# Patient Record
Sex: Male | Born: 1970 | Race: White | Hispanic: No | Marital: Married | Smoking: Never smoker
Health system: Southern US, Community
[De-identification: ages and names within clinical notes are randomized; demographics above are authoritative.]

## PROBLEM LIST (undated history)

## (undated) DIAGNOSIS — K219 Gastro-esophageal reflux disease without esophagitis: Secondary | ICD-10-CM

## (undated) DIAGNOSIS — I10 Essential (primary) hypertension: Secondary | ICD-10-CM

## (undated) HISTORY — PX: ADENOIDECTOMY: SUR15

## (undated) HISTORY — DX: Essential (primary) hypertension: I10

## (undated) HISTORY — PX: TONSILLECTOMY: SUR1361

## (undated) HISTORY — DX: Gastro-esophageal reflux disease without esophagitis: K21.9

---
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
## 2003-04-07 HISTORY — PX: FOOT SURGERY: SHX648

## 2011-04-07 HISTORY — PX: SHOULDER SURGERY: SHX246

## 2014-04-06 HISTORY — PX: BACK SURGERY: SHX140

## 2015-04-07 HISTORY — PX: BUNIONECTOMY: SHX129

## 2016-03-04 ENCOUNTER — Ambulatory Visit: Payer: Self-pay | Admitting: Allergy and Immunology

## 2016-03-04 ENCOUNTER — Ambulatory Visit (INDEPENDENT_AMBULATORY_CARE_PROVIDER_SITE_OTHER): Payer: No Typology Code available for payment source | Admitting: Allergy and Immunology

## 2016-03-04 ENCOUNTER — Encounter: Payer: Self-pay | Admitting: Allergy and Immunology

## 2016-03-04 VITALS — BP 150/98 | HR 88 | Temp 97.4°F | Resp 18 | Ht 68.43 in | Wt 266.8 lb

## 2016-03-04 DIAGNOSIS — J3089 Other allergic rhinitis: Secondary | ICD-10-CM | POA: Diagnosis not present

## 2016-03-04 MED ORDER — AZELASTINE HCL 0.1 % NA SOLN: 2.0000 | Freq: Two times a day (BID) | NASAL | 3 refills | Status: DC

## 2016-03-04 MED ORDER — AZELASTINE HCL 0.1 % NA SOLN: 2.0000 | Freq: Two times a day (BID) | NASAL | 0 refills | Status: DC

## 2016-03-04 MED ORDER — OLOPATADINE HCL 0.7 % OP SOLN: 1.0000 [drp] | OPHTHALMIC | 0 refills | Status: DC

## 2016-03-04 MED ORDER — MONTELUKAST SODIUM 10 MG PO TABS: 10.0000 mg | ORAL_TABLET | Freq: Every day | ORAL | 0 refills | Status: AC

## 2016-03-04 MED ORDER — METHYLPREDNISOLONE ACETATE 80 MG/ML IJ SUSP
80.0000 mg | Freq: Once | INTRAMUSCULAR | Status: AC
  Administered 2016-03-04: 80 mg via INTRAMUSCULAR

## 2016-03-04 MED ORDER — OLOPATADINE HCL 0.7 % OP SOLN: 1.0000 [drp] | OPHTHALMIC | 5 refills | Status: DC

## 2016-03-04 MED ORDER — MONTELUKAST SODIUM 10 MG PO TABS: 10.0000 mg | ORAL_TABLET | Freq: Every day | ORAL | 5 refills | Status: DC

## 2016-03-04 NOTE — Progress Notes (Signed)
Dear [name],  Thank you for referring Justin Greer to the [facility] [place] on 03/04/2016.   Below is a summation of this patient's evaluation and recommendations.  Thank you for your referral. I will keep you informed about this patient's response to treatment.   If you have any questions please do not hesitate to contact me.   Sincerely,  Jessica Priest, MD  [facility]   ______________________________________________________________________    NEW PATIENT NOTE  Referring Provider: [name], [place] Primary Provider: [facility] Date of office visit: 03/04/2016    Subjective:   Chief Complaint:  Justin Greer (DOB: [birth year]) is a 45 y.o. male who presents to the clinic on 03/04/2016 with a chief complaint of Sinus Problem (sneezing, runny nose, congestion); Burning Eyes (watery, itchy); and itchy throat .     HPI: Justin Greer presents to this clinic in evaluation of allergies. He has a long history of seasonal allergies that were precipitated by exposure to cat and pollen, but since 2017 he has had perennial symptoms manifested as severe and intense sneezing and runny nose and nasal congestion and itchy throat and itchy eyes and itchy ears that has not responded to the administration of several different nasal steroids and antihistamines and decongestants. He has not really noted any significant environmental change that may account for his increased sensitivity.  Past Medical History:  Diagnosis Date  . GERD (gastroesophageal reflux disease)   . High blood pressure     Past Surgical History:  Procedure Laterality Date  . ADENOIDECTOMY    . BACK SURGERY  2016  . BUNIONECTOMY Right 2017  . FOOT SURGERY Right 2005  . lymphnode removal  1981  . SHOULDER SURGERY Left 2013  . TONSILLECTOMY        Medication List      ANTIHISTAMINE PO Take 1 tablet by mouth daily as  needed.   diazepam 10 MG tablet Commonly known as:  VALIUM Take 10 mg by mouth every 8 (eight) hours as needed for anxiety.   DULoxetine 60 MG capsule Commonly known as:  CYMBALTA Take 60 mg by mouth daily.   hydrochlorothiazide 25 MG tablet Commonly known as:  HYDRODIURIL Take 25 mg by mouth daily.   losartan 100 MG tablet Commonly known as:  COZAAR Take 10 mg by mouth daily.   OXcarbazepine 150 MG tablet Commonly known as:  TRILEPTAL Take 150 mg by mouth daily.   pantoprazole 40 MG tablet Commonly known as:  PROTONIX Take 40 mg by mouth daily.   propranolol 10 MG tablet Commonly known as:  INDERAL Take 10 mg by mouth daily.   spironolactone 25 MG tablet Commonly known as:  ALDACTONE Take 25 mg by mouth daily.       No Known Allergies  Review of systems negative except as noted in HPI / PMHx or noted below:  Review of Systems  Constitutional: Negative.   HENT: Negative.   Eyes: Negative.   Respiratory: Negative.   Cardiovascular: Negative.   Gastrointestinal: Negative.   Genitourinary: Negative.   Musculoskeletal: Negative.   Skin: Negative.   Neurological: Negative.   Endo/Heme/Allergies: Negative.   Psychiatric/Behavioral: Negative.     Family History  Problem Relation Age of Onset  . High blood pressure Mother   . Heart disease Mother   . Diabetes Mother   . Diabetes Father   . Heart disease Father   .  High blood pressure Father   . Stroke Father   . High blood pressure Brother     Social History   Social History  . Marital status: Married    Spouse name: N/A  . Number of children: N/A  . Years of education: N/A   Occupational History  . Not on file.   Social History Main Topics  . Smoking status: Never Smoker  . Smokeless tobacco: Never Used  . Alcohol use 7.2 oz/week    12 Cans of beer per week  . Drug use: No  . Sexual activity: Not on file   Other Topics Concern  . Not on file   Social History Narrative  . No narrative  on file    Environmental and Social history  Lives in a house with a dry environment, dogs located inside the household, carpeting in the bedroom, no plastic on the bed or pillow, and no smokers located inside the household. He is a disabled Cytogeneticistveteran.  Objective:   Vitals:   03/04/16 1335  BP: (!) 150/98  Pulse: 88  Resp: 18  Temp: 97.4 F (36.3 C)   Height: 5' 8.42" (173.8 cm) Weight: 266 lb 12.8 oz (121 kg)  Physical Exam  Constitutional: He is well-developed, well-nourished, and in no distress.  HENT:  Head: Normocephalic. Head is without right periorbital erythema and without left periorbital erythema.  Right Ear: Tympanic membrane, external ear and ear canal normal.  Left Ear: Tympanic membrane, external ear and ear canal normal.  Nose: Mucosal edema present. No rhinorrhea.  Mouth/Throat: Uvula is midline, oropharynx is clear and moist and mucous membranes are normal. No oropharyngeal exudate.  Eyes: Conjunctivae and lids are normal. Pupils are equal, round, and reactive to light.  Neck: Trachea normal. No tracheal tenderness present. No tracheal deviation present. No thyromegaly present.  Cardiovascular: Normal rate, regular rhythm, S1 normal, S2 normal and normal heart sounds.   No murmur heard. Pulmonary/Chest: Effort normal and breath sounds normal. No stridor. No tachypnea. No respiratory distress. He has no wheezes. He has no rales. He exhibits no tenderness.  Abdominal: Soft. He exhibits no distension and no mass. There is no hepatosplenomegaly. There is no tenderness. There is no rebound and no guarding.  Musculoskeletal: He exhibits no edema or tenderness.  Lymphadenopathy:       Head (right side): No tonsillar adenopathy present.       Head (left side): No tonsillar adenopathy present.    He has no cervical adenopathy.    He has no axillary adenopathy.  Neurological: He is alert. Gait normal.  Skin: No rash noted. He is not diaphoretic. No erythema. No pallor.  Nails show no clubbing.  Psychiatric: Mood and affect normal.    Diagnostics: Allergy skin tests were not performed secondary to the recent administration of a antihistamine.  Assessment and Plan:    1. Other allergic rhinitis     1. Return to clinic Friday for skin testing without any antihistamines  2. Depo-Medrol 80 IM delivered in clinic  3. Use a combination of the following every day:   A. montelukast 10 mg tablet 1 time per day  B. Rhinocort one spray each nostril one time per day. Sample. Coupon  4. If needed:   A. Azelastine 2 sprays each nostril twice a day  B. Pazeo one drop each eye one time per day. Sample  5. Immunotherapy?  Justin Greer obviously has very significant atopic disease that has failed medical therapy and he is definitely  a candidate for immunotherapy. For today I have given him a systemic steroid in addition to a leukotriene modifier and nasal steroid to use on a consistent basis. I'll see him back in this clinic at the end of this week for skin testing to define his aeroallergens hypersensitivity profile.  Jessica PriestEric J. Yaniv Lage, MD [place] [facility] [place]

## 2016-03-04 NOTE — Patient Instructions (Addendum)
  1. Return to clinic Friday for skin testing without any antihistamines  2. Depo-Medrol 80 IM delivered in clinic  3. Use a combination of the following every day:   A. montelukast 10 mg tablet 1 time per day  B. Rhinocort one spray each nostril one time per day. Sample. Coupon  4. If needed:   A. Azelastine 2 sprays each nostril twice a day  B. Pazeo one drop each eye one time per day. Sample  5. Immunotherapy?

## 2016-03-05 ENCOUNTER — Other Ambulatory Visit: Payer: Self-pay

## 2016-03-05 MED ORDER — KETOTIFEN FUMARATE 0.025 % OP SOLN: 1.0000 [drp] | Freq: Two times a day (BID) | OPHTHALMIC | 3 refills | Status: DC

## 2016-03-05 MED ORDER — KETOTIFEN FUMARATE 0.025 % OP SOLN: 1.0000 [drp] | Freq: Two times a day (BID) | OPHTHALMIC | 0 refills | Status: DC

## 2016-03-06 ENCOUNTER — Ambulatory Visit (INDEPENDENT_AMBULATORY_CARE_PROVIDER_SITE_OTHER): Payer: No Typology Code available for payment source | Admitting: Allergy and Immunology

## 2016-03-06 ENCOUNTER — Encounter: Payer: Self-pay | Admitting: Allergy and Immunology

## 2016-03-06 DIAGNOSIS — J3089 Other allergic rhinitis: Secondary | ICD-10-CM | POA: Diagnosis not present

## 2016-03-06 NOTE — Progress Notes (Signed)
Justin Greer presents to this clinic to have skin testing performed. Allergy skin testing directed against a panel of aeroallergens and foods was completed. He demonstrated hypersensitivity to grasses, weeds, dust mite, cat, and dog and mouse. He will perform allergen avoidance measures and continue to use medical therapy as prescribed during his initial evaluation and we will see him back in this clinic in approximately 4 weeks.

## 2016-03-18 ENCOUNTER — Other Ambulatory Visit: Payer: Self-pay | Admitting: Allergy and Immunology

## 2016-03-18 DIAGNOSIS — J3089 Other allergic rhinitis: Secondary | ICD-10-CM

## 2016-03-23 DIAGNOSIS — J3089 Other allergic rhinitis: Secondary | ICD-10-CM

## 2016-03-24 DIAGNOSIS — J3089 Other allergic rhinitis: Secondary | ICD-10-CM

## 2016-03-24 NOTE — Progress Notes (Signed)
Vials made 03-24-16.  jm 

## 2016-04-13 ENCOUNTER — Ambulatory Visit: Payer: Self-pay | Admitting: *Deleted

## 2016-04-13 ENCOUNTER — Ambulatory Visit (INDEPENDENT_AMBULATORY_CARE_PROVIDER_SITE_OTHER): Payer: No Typology Code available for payment source | Admitting: Allergy and Immunology

## 2016-04-13 ENCOUNTER — Ambulatory Visit: Payer: No Typology Code available for payment source

## 2016-04-13 ENCOUNTER — Encounter: Payer: Self-pay | Admitting: Allergy and Immunology

## 2016-04-13 VITALS — BP 138/80 | HR 72 | Resp 18

## 2016-04-13 DIAGNOSIS — J3089 Other allergic rhinitis: Secondary | ICD-10-CM | POA: Diagnosis not present

## 2016-04-13 DIAGNOSIS — J309 Allergic rhinitis, unspecified: Secondary | ICD-10-CM

## 2016-04-13 MED ORDER — EPINEPHRINE 0.3 MG/0.3ML IJ SOAJ: 0.3000 mg | Freq: Once | INTRAMUSCULAR | 1 refills | Status: AC

## 2016-04-13 NOTE — Progress Notes (Signed)
Follow-up Note  [facility] Provider: [facility] Date of Office Visit: 04/13/2016  Subjective:   Justin Greer (DOB: [birth year]) is a 46 y.o. male who [facility] 04[facility]/2016 in re-evaluation of the following:  HPI: Check returns to this clinic in reevaluation of his allergic rhinitis treated with immunotherapy. I last saw him in this clinic in November 2017.  While performing allergen avoidance measures and consistently using anti-inflammatory medications for his upper airways and using a course of immunotherapy he has had almost complete elimination of all his atopic respiratory and conjunctival symptoms. He feels great at this point in time. He states that he is "180" from where he was previously. His immunotherapy has been going well without any incident or adverse effect.  Allergies as of 04/13/2016   No Known Allergies     Medication List      ANTIHISTAMINE PO Take 1 tablet by mouth daily as needed.   diazepam 10 MG tablet Commonly known as:  VALIUM Take 10 mg by mouth every 8 (eight) hours as needed for anxiety.   DULoxetine 60 MG capsule Commonly known as:  CYMBALTA Take 60 mg by mouth daily.   EPINEPHrine 0.3 mg/0.3 mL Soaj injection Commonly known as:  EPI-PEN Inject 0.3 mLs (0.3 mg total) into the muscle once. As needed for life-threatening allergic reactions   hydrochlorothiazide 25 MG tablet Commonly known as:  HYDRODIURIL Take 25 mg by mouth daily.   losartan 100 MG tablet Commonly known as:  COZAAR Take 10 mg by mouth daily.   montelukast 10 MG tablet Commonly known as:  SINGULAIR Take 1 tablet (10 mg total) by mouth at bedtime.   OXcarbazepine 150 MG tablet Commonly known as:  TRILEPTAL Take 150 mg by mouth daily.   pantoprazole 40 MG tablet Commonly known as:  PROTONIX Take 40 mg by mouth daily.   PAZEO 0.7 % Soln Generic drug:  Olopatadine HCl Apply to eye.     propranolol 10 MG tablet Commonly known as:  INDERAL Take 10 mg by mouth daily.   spironolactone 25 MG tablet Commonly known as:  ALDACTONE Take 25 mg by mouth daily.       Past Medical History:  Diagnosis Date  . GERD (gastroesophageal reflux disease)   . High blood pressure     Past Surgical History:  Procedure Laterality Date  . ADENOIDECTOMY    . BACK SURGERY  2016  . BUNIONECTOMY Right 2017  . FOOT SURGERY Right 2005  . lymphnode removal  1981  . SHOULDER SURGERY Left 2013  . TONSILLECTOMY      Review of systems negative except as noted in HPI / PMHx or noted below:  Review of Systems  Constitutional: Negative.   HENT: Negative.   Eyes: Negative.   Respiratory: Negative.   Cardiovascular: Negative.   Gastrointestinal: Negative.   Genitourinary: Negative.   Musculoskeletal: Negative.   Skin: Negative.   Neurological: Negative.   Endo/Heme/Allergies: Negative.   Psychiatric/Behavioral: Negative.      Objective:   Vitals:   04/13/16 1113  BP: 138/80  Pulse: 72  Resp: 18          Physical Exam  Constitutional: He is well-developed, well-nourished, and in no distress.  HENT:  Head: Normocephalic.  Right Ear: Tympanic membrane, external ear and ear canal normal.  Left Ear: Tympanic membrane, external ear and ear canal normal.  Nose: Nose normal. No  mucosal edema or rhinorrhea.  Mouth/Throat: Uvula is midline, oropharynx is clear and moist and mucous membranes are normal. No oropharyngeal exudate.  Eyes: Conjunctivae are normal.  Neck: Trachea normal. No tracheal tenderness present. No tracheal deviation present. No thyromegaly present.  Cardiovascular: Normal rate, regular rhythm, S1 normal, S2 normal and normal heart sounds.   No murmur heard. Pulmonary/Chest: Breath sounds normal. No stridor. No respiratory distress. He has no wheezes. He has no rales.  Musculoskeletal: He exhibits no edema.  Lymphadenopathy:       Head (right side): No  tonsillar adenopathy present.       Head (left side): No tonsillar adenopathy present.    He has no cervical adenopathy.  Neurological: He is alert. Gait normal.  Skin: No rash noted. He is not diaphoretic. No erythema. Nails show no clubbing.  Psychiatric: Mood and affect normal.    Diagnostics: None   Assessment and Plan:   1. Other allergic rhinitis     1. Continue allergen avoidance measures  2. Continue a combination of the following every day:   A. montelukast 10 mg tablet 1 time per day  B. Rhinocort one spray each nostril one time per day. Sample. Coupon  3. If needed:   A. Azelastine 2 sprays each nostril twice a day  B. Pazeo one drop each eye one time per day. Sample  4. Continue immunotherapy and Epi-Pen  5. Return in 6 months or earlier if problem  Check appears to be doing relatively well at this point in time. He will continue on this therapy which includes immunotherapy and we'll see how things go as he progresses through the springtime season. I'll see him back in this clinic in 6 months or earlier if there is a problem.  Laurette SchimkeEric Jmari Pelc, MD [facility]

## 2016-04-13 NOTE — Progress Notes (Signed)
Patient started allergy injections. Reviewed side effects, inj sch B 1-2 times weekly and how/when to use Epipen rx sent

## 2016-04-13 NOTE — Patient Instructions (Addendum)
  1. Continue allergen avoidance measures  2. Continue a combination of the following every day:   A. montelukast 10 mg tablet 1 time per day  B. Rhinocort one spray each nostril one time per day. Sample. Coupon  3. If needed:   A. Azelastine 2 sprays each nostril twice a day  B. Pazeo one drop each eye one time per day. Sample  4. Continue immunotherapy and Epi-Pen  5. Return in 6 months or earlier if problem

## 2016-04-27 ENCOUNTER — Ambulatory Visit (INDEPENDENT_AMBULATORY_CARE_PROVIDER_SITE_OTHER): Payer: No Typology Code available for payment source | Admitting: *Deleted

## 2016-04-27 DIAGNOSIS — J309 Allergic rhinitis, unspecified: Secondary | ICD-10-CM | POA: Diagnosis not present

## 2016-05-01 ENCOUNTER — Ambulatory Visit (INDEPENDENT_AMBULATORY_CARE_PROVIDER_SITE_OTHER): Payer: No Typology Code available for payment source | Admitting: *Deleted

## 2016-05-01 DIAGNOSIS — J309 Allergic rhinitis, unspecified: Secondary | ICD-10-CM | POA: Diagnosis not present

## 2016-05-07 ENCOUNTER — Ambulatory Visit (INDEPENDENT_AMBULATORY_CARE_PROVIDER_SITE_OTHER): Payer: No Typology Code available for payment source | Admitting: *Deleted

## 2016-05-07 DIAGNOSIS — J309 Allergic rhinitis, unspecified: Secondary | ICD-10-CM | POA: Diagnosis not present

## 2016-05-11 ENCOUNTER — Ambulatory Visit (INDEPENDENT_AMBULATORY_CARE_PROVIDER_SITE_OTHER): Payer: No Typology Code available for payment source | Admitting: *Deleted

## 2016-05-11 DIAGNOSIS — J309 Allergic rhinitis, unspecified: Secondary | ICD-10-CM

## 2016-05-15 ENCOUNTER — Ambulatory Visit (INDEPENDENT_AMBULATORY_CARE_PROVIDER_SITE_OTHER): Payer: No Typology Code available for payment source | Admitting: *Deleted

## 2016-05-15 DIAGNOSIS — J309 Allergic rhinitis, unspecified: Secondary | ICD-10-CM

## 2016-06-01 ENCOUNTER — Ambulatory Visit (INDEPENDENT_AMBULATORY_CARE_PROVIDER_SITE_OTHER): Payer: No Typology Code available for payment source | Admitting: *Deleted

## 2016-06-01 DIAGNOSIS — J309 Allergic rhinitis, unspecified: Secondary | ICD-10-CM

## 2016-06-08 ENCOUNTER — Ambulatory Visit (INDEPENDENT_AMBULATORY_CARE_PROVIDER_SITE_OTHER): Payer: No Typology Code available for payment source | Admitting: *Deleted

## 2016-06-08 DIAGNOSIS — J309 Allergic rhinitis, unspecified: Secondary | ICD-10-CM

## 2016-06-18 ENCOUNTER — Ambulatory Visit (INDEPENDENT_AMBULATORY_CARE_PROVIDER_SITE_OTHER): Payer: No Typology Code available for payment source | Admitting: *Deleted

## 2016-06-18 DIAGNOSIS — J309 Allergic rhinitis, unspecified: Secondary | ICD-10-CM

## 2016-06-26 ENCOUNTER — Ambulatory Visit (INDEPENDENT_AMBULATORY_CARE_PROVIDER_SITE_OTHER): Payer: No Typology Code available for payment source

## 2016-06-26 DIAGNOSIS — J309 Allergic rhinitis, unspecified: Secondary | ICD-10-CM | POA: Diagnosis not present

## 2016-07-10 ENCOUNTER — Ambulatory Visit (INDEPENDENT_AMBULATORY_CARE_PROVIDER_SITE_OTHER): Payer: No Typology Code available for payment source | Admitting: *Deleted

## 2016-07-10 DIAGNOSIS — J309 Allergic rhinitis, unspecified: Secondary | ICD-10-CM

## 2016-07-17 ENCOUNTER — Ambulatory Visit (INDEPENDENT_AMBULATORY_CARE_PROVIDER_SITE_OTHER): Payer: No Typology Code available for payment source | Admitting: *Deleted

## 2016-07-17 DIAGNOSIS — J309 Allergic rhinitis, unspecified: Secondary | ICD-10-CM | POA: Diagnosis not present

## 2016-07-23 ENCOUNTER — Ambulatory Visit (INDEPENDENT_AMBULATORY_CARE_PROVIDER_SITE_OTHER): Payer: No Typology Code available for payment source | Admitting: *Deleted

## 2016-07-23 DIAGNOSIS — J309 Allergic rhinitis, unspecified: Secondary | ICD-10-CM | POA: Diagnosis not present

## 2016-07-30 ENCOUNTER — Ambulatory Visit (INDEPENDENT_AMBULATORY_CARE_PROVIDER_SITE_OTHER): Payer: No Typology Code available for payment source | Admitting: *Deleted

## 2016-07-30 DIAGNOSIS — J309 Allergic rhinitis, unspecified: Secondary | ICD-10-CM | POA: Diagnosis not present

## 2016-08-07 ENCOUNTER — Ambulatory Visit (INDEPENDENT_AMBULATORY_CARE_PROVIDER_SITE_OTHER): Payer: No Typology Code available for payment source | Admitting: *Deleted

## 2016-08-07 DIAGNOSIS — J309 Allergic rhinitis, unspecified: Secondary | ICD-10-CM

## 2016-08-14 ENCOUNTER — Ambulatory Visit (INDEPENDENT_AMBULATORY_CARE_PROVIDER_SITE_OTHER): Payer: No Typology Code available for payment source | Admitting: *Deleted

## 2016-08-14 DIAGNOSIS — J309 Allergic rhinitis, unspecified: Secondary | ICD-10-CM

## 2016-08-28 ENCOUNTER — Ambulatory Visit (INDEPENDENT_AMBULATORY_CARE_PROVIDER_SITE_OTHER): Payer: No Typology Code available for payment source | Admitting: *Deleted

## 2016-08-28 DIAGNOSIS — J309 Allergic rhinitis, unspecified: Secondary | ICD-10-CM | POA: Diagnosis not present

## 2016-09-04 ENCOUNTER — Ambulatory Visit (INDEPENDENT_AMBULATORY_CARE_PROVIDER_SITE_OTHER): Payer: No Typology Code available for payment source | Admitting: *Deleted

## 2016-09-04 DIAGNOSIS — J309 Allergic rhinitis, unspecified: Secondary | ICD-10-CM

## 2016-09-11 ENCOUNTER — Ambulatory Visit (INDEPENDENT_AMBULATORY_CARE_PROVIDER_SITE_OTHER): Payer: No Typology Code available for payment source | Admitting: *Deleted

## 2016-09-11 DIAGNOSIS — J309 Allergic rhinitis, unspecified: Secondary | ICD-10-CM

## 2016-10-12 ENCOUNTER — Ambulatory Visit: Payer: No Typology Code available for payment source | Admitting: Allergy and Immunology

## 2016-10-12 DIAGNOSIS — J309 Allergic rhinitis, unspecified: Secondary | ICD-10-CM

## 2016-10-26 ENCOUNTER — Telehealth: Payer: Self-pay | Admitting: Allergy and Immunology

## 2016-10-26 ENCOUNTER — Telehealth: Payer: Self-pay

## 2016-10-26 NOTE — Telephone Encounter (Signed)
Candice called pt & adjusted no show fee - kt

## 2016-10-26 NOTE — Telephone Encounter (Signed)
Pts wife called today stating pt will be stopping shots

## 2016-10-26 NOTE — Telephone Encounter (Signed)
Pt wife called about bill for no show fee and after checking his account he would be a 1st time office visit no show. There is another "no show" marked on this account but it was for immunotherapy visit that was not checked in & out back in January. Please review account and call patient wife back for confirmation of no show fee removal. Thanks

## 2017-02-15 ENCOUNTER — Telehealth: Payer: Self-pay | Admitting: Allergy and Immunology

## 2017-02-15 NOTE — Telephone Encounter (Signed)
[facility]ergy In[facility]

## 2018-03-17 ENCOUNTER — Telehealth: Payer: Self-pay

## 2018-03-17 NOTE — Telephone Encounter (Signed)
Patient has not been seen since 04/2016. He no showed his appointments after that date. I am closing the [facility].

## 2020-07-08 ENCOUNTER — Emergency Department (HOSPITAL_COMMUNITY): Payer: No Typology Code available for payment source

## 2020-07-08 ENCOUNTER — Inpatient Hospital Stay (HOSPITAL_COMMUNITY)
Admission: EM | Admit: 2020-07-08 | Discharge: 2020-07-10 | DRG: 563 | Disposition: A | Discharge status: Home / Self Care | Payer: No Typology Code available for payment source | Attending: Internal Medicine | Admitting: Internal Medicine

## 2020-07-08 ENCOUNTER — Other Ambulatory Visit: Payer: Self-pay

## 2020-07-08 DIAGNOSIS — L03116 Cellulitis of left lower limb: Secondary | ICD-10-CM

## 2020-07-08 DIAGNOSIS — Z79899 Other long term (current) drug therapy: Secondary | ICD-10-CM

## 2020-07-08 DIAGNOSIS — S82402A Unspecified fracture of shaft of left fibula, initial encounter for closed fracture: Principal | ICD-10-CM | POA: Diagnosis present

## 2020-07-08 DIAGNOSIS — Z20822 Contact with and (suspected) exposure to covid-19: Secondary | ICD-10-CM | POA: Diagnosis present

## 2020-07-08 DIAGNOSIS — Z833 Family history of diabetes mellitus: Secondary | ICD-10-CM

## 2020-07-08 DIAGNOSIS — M869 Osteomyelitis, unspecified: Secondary | ICD-10-CM

## 2020-07-08 DIAGNOSIS — X16XXXA Contact with hot heating appliances, radiators and pipes, initial encounter: Secondary | ICD-10-CM | POA: Diagnosis present

## 2020-07-08 DIAGNOSIS — I10 Essential (primary) hypertension: Secondary | ICD-10-CM | POA: Diagnosis present

## 2020-07-08 DIAGNOSIS — S81802A Unspecified open wound, left lower leg, initial encounter: Secondary | ICD-10-CM

## 2020-07-08 DIAGNOSIS — Z6841 Body Mass Index (BMI) 40.0 and over, adult: Secondary | ICD-10-CM

## 2020-07-08 DIAGNOSIS — S80829A Blister (nonthermal), unspecified lower leg, initial encounter: Secondary | ICD-10-CM | POA: Diagnosis present

## 2020-07-08 DIAGNOSIS — K219 Gastro-esophageal reflux disease without esophagitis: Secondary | ICD-10-CM | POA: Diagnosis present

## 2020-07-08 DIAGNOSIS — Z888 Allergy status to other drugs, medicaments and biological substances status: Secondary | ICD-10-CM

## 2020-07-08 DIAGNOSIS — T24232A Burn of second degree of left lower leg, initial encounter: Secondary | ICD-10-CM

## 2020-07-08 DIAGNOSIS — M86162 Other acute osteomyelitis, left tibia and fibula: Secondary | ICD-10-CM

## 2020-07-08 DIAGNOSIS — L98499 Non-pressure chronic ulcer of skin of other sites with unspecified severity: Secondary | ICD-10-CM | POA: Diagnosis present

## 2020-07-08 DIAGNOSIS — Z823 Family history of stroke: Secondary | ICD-10-CM

## 2020-07-08 DIAGNOSIS — T24002A Burn of unspecified degree of unspecified site of left lower limb, except ankle and foot, initial encounter: Secondary | ICD-10-CM | POA: Diagnosis present

## 2020-07-08 DIAGNOSIS — Z8249 Family history of ischemic heart disease and other diseases of the circulatory system: Secondary | ICD-10-CM

## 2020-07-08 DIAGNOSIS — R609 Edema, unspecified: Secondary | ICD-10-CM | POA: Diagnosis present

## 2020-07-08 LAB — CBC WITH DIFFERENTIAL/PLATELET
Abs Immature Granulocytes: 0.02 10*3/uL (ref 0.00–0.07)
Basophils Absolute: 0.1 10*3/uL (ref 0.0–0.1)
Basophils Relative: 2 %
Eosinophils Absolute: 0.1 10*3/uL (ref 0.0–0.5)
Eosinophils Relative: 1 %
HCT: 40.1 % (ref 39.0–52.0)
Hemoglobin: 13.7 g/dL (ref 13.0–17.0)
Immature Granulocytes: 0 %
Lymphocytes Relative: 17 %
Lymphs Abs: 1 10*3/uL (ref 0.7–4.0)
MCH: 31.7 pg (ref 26.0–34.0)
MCHC: 34.2 g/dL (ref 30.0–36.0)
MCV: 92.8 fL (ref 80.0–100.0)
Monocytes Absolute: 0.7 10*3/uL (ref 0.1–1.0)
Monocytes Relative: 11 %
Neutro Abs: 4 10*3/uL (ref 1.7–7.7)
Neutrophils Relative %: 69 %
Platelets: 216 10*3/uL (ref 150–400)
RBC: 4.32 MIL/uL (ref 4.22–5.81)
RDW: 13.2 % (ref 11.5–15.5)
WBC: 5.9 10*3/uL (ref 4.0–10.5)
nRBC: 0 % (ref 0.0–0.2)

## 2020-07-08 LAB — COMPREHENSIVE METABOLIC PANEL
ALT: 42 U/L (ref 0–44)
AST: 54 U/L — ABNORMAL HIGH (ref 15–41)
Albumin: 3.5 g/dL (ref 3.5–5.0)
Alkaline Phosphatase: 118 U/L (ref 38–126)
Anion gap: 9 (ref 5–15)
BUN: 14 mg/dL (ref 6–20)
CO2: 28 mmol/L (ref 22–32)
Calcium: 9.3 mg/dL (ref 8.9–10.3)
Chloride: 97 mmol/L — ABNORMAL LOW (ref 98–111)
Creatinine, Ser: 1.33 mg/dL — ABNORMAL HIGH (ref 0.61–1.24)
GFR, Estimated: >60 mL/min (ref >60)
Glucose, Bld: 84 mg/dL (ref 70–99)
Potassium: 4.5 mmol/L (ref 3.5–5.1)
Sodium: 134 mmol/L — ABNORMAL LOW (ref 135–145)
Total Bilirubin: 0.9 mg/dL (ref 0.3–1.2)
Total Protein: 7.1 g/dL (ref 6.5–8.1)

## 2020-07-08 MED ORDER — MORPHINE SULFATE (PF) 4 MG/ML IV SOLN
4.0000 mg | INTRAVENOUS | Status: DC | PRN
  Administered 2020-07-09 (×3): 4 mg via INTRAVENOUS
  Filled 2020-07-08 (×3): qty 1

## 2020-07-08 MED ORDER — KETOROLAC TROMETHAMINE 30 MG/ML IJ SOLN
30.0000 mg | Freq: Once | INTRAMUSCULAR | Status: AC
  Administered 2020-07-08: 30 mg via INTRAVENOUS
  Filled 2020-07-08: qty 1

## 2020-07-08 MED ORDER — PIPERACILLIN-TAZOBACTAM 3.375 G IVPB 30 MIN
3.3750 g | Freq: Once | INTRAVENOUS | Status: AC
  Administered 2020-07-09: 3.375 g via INTRAVENOUS
  Filled 2020-07-08: qty 50

## 2020-07-08 MED ORDER — MORPHINE SULFATE (PF) 4 MG/ML IV SOLN
4.0000 mg | Freq: Once | INTRAVENOUS | Status: AC
  Administered 2020-07-08: 4 mg via INTRAVENOUS
  Filled 2020-07-08: qty 1

## 2020-07-08 MED ORDER — VANCOMYCIN HCL 10 G IV SOLR
2500.0000 mg | Freq: Once | INTRAVENOUS | Status: AC
  Administered 2020-07-09: 2500 mg via INTRAVENOUS
  Filled 2020-07-08: qty 2500

## 2020-07-08 NOTE — ED Provider Notes (Deleted)
[facility] EMERGENCY DEPARTMENT Provider Note   CSN: [id number] Arrival date & time: 07/08/20  1452     History No chief complaint on file.   Justin Greer is a 50 y.o. male.  HPI Disregard this note.  Cannot delete at this time.  There is a completed note for this encounter.    Past Medical History:  Diagnosis Date  . GERD (gastroesophageal reflux disease)   . High blood pressure     There are no problems to display for this patient.   Past Surgical History:  Procedure Laterality Date  . ADENOIDECTOMY    . BACK SURGERY  2016  . BUNIONECTOMY Right 2017  . FOOT SURGERY Right 2005  . lymphnode removal  1981  . SHOULDER SURGERY Left 2013  . TONSILLECTOMY         Family History  Problem Relation Age of Onset  . High blood pressure Mother   . Heart disease Mother   . Diabetes Mother   . Diabetes Father   . Heart disease Father   . High blood pressure Father   . Stroke Father   . High blood pressure Brother     Social History   Tobacco Use  . Smoking status: Never Smoker  . Smokeless tobacco: Never Used  Substance Use Topics  . Alcohol use: Yes    Alcohol/week: 12.0 standard drinks    Types: 12 Cans of beer per week  . Drug use: No    Home Medications Prior to Admission medications   Medication Sig Start Date End Date Taking? Authorizing Provider  diazepam (VALIUM) 10 MG tablet Take 10 mg by mouth every 8 (eight) hours as needed for anxiety.    [provider]  DULoxetine (CYMBALTA) 60 MG capsule Take 60 mg by mouth daily.    [provider]  hydrochlorothiazide (HYDRODIURIL) 25 MG tablet Take 25 mg by mouth daily.    [provider]  losartan (COZAAR) 100 MG tablet Take 10 mg by mouth daily.    [provider]  montelukast (SINGULAIR) 10 MG tablet Take 1 tablet (10 mg total) by mouth at bedtime. 03/04/16   Kozlow, Alvira Philips, MD  Olopatadine HCl (PAZEO) 0.7 % SOLN Apply to eye.    [provider]  OXcarbazepine (TRILEPTAL) 150 MG tablet Take 150 mg by mouth daily.    [provider]  pantoprazole (PROTONIX) 40 MG tablet Take 40 mg by mouth daily.    [provider]  propranolol (INDERAL) 10 MG tablet Take 10 mg by mouth daily.    [provider]  spironolactone (ALDACTONE) 25 MG tablet Take 25 mg by mouth daily.    [provider]  Triprolidine-Pseudoephedrine (ANTIHISTAMINE PO) Take 1 tablet by mouth daily as needed.    [provider]    Allergies    Lisinopril, Venlafaxine, Sildenafil, and Amlodipine  Review of Systems   Review of Systems  Physical Exam Updated Vital Signs BP (!) 146/88 (BP Location: Right Arm)   Pulse 78   Temp 98 F (36.7 C) (Oral)   Resp 16   SpO2 98%   Physical Exam        ED Results / Procedures / Treatments   Labs (all labs ordered are listed, but only abnormal results are displayed) Labs Reviewed  COMPREHENSIVE METABOLIC PANEL - Abnormal; Notable for the following components:      Result Value   Sodium 134 (*)    Chloride 97 (*)  Creatinine, Ser 1.33 (*)    AST 54 (*)    All other components within normal limits  CBC WITH DIFFERENTIAL/PLATELET    EKG None  Radiology DG Tibia/Fibula Left  Result Date: 07/08/2020 CLINICAL DATA:  Pain Infection EXAM: LEFT TIBIA AND FIBULA - 2 VIEW COMPARISON:  None. FINDINGS: Mild diffuse soft tissue swelling of the left lower leg. There is irregularity of the medial cortex of the mid fibular diaphysis which could represent osteomyelitis. IMPRESSION: Mild diffuse soft tissue swelling of the left lower leg with cortical irregularity of the lateral cortex of the mid fibular diaphysis suspicious for osteomyelitis. Electronically Signed   By: Acquanetta Belling M.D.   On: 07/08/2020 16:13    Procedures Procedures   Medications Ordered in ED Medications - No data to display  ED Course  I have reviewed the triage vital signs and the nursing  notes.  Pertinent labs & imaging results that were available during my care of the patient were reviewed by me and considered in my medical decision making (see chart for details).    MDM Rules/Calculators/A&P                           Final Clinical Impression(s) / ED Diagnoses Final diagnoses:  None    Rx / DC Orders ED Discharge Orders    None       Arby Barrette, MD 07/17/20 1132

## 2020-07-08 NOTE — ED Triage Notes (Signed)
Emergency Medicine Provider Triage Evaluation Note  CHRSTOPHER MALENFANT II , a 50 y.o. male  was evaluated in triage.  Pt complains of sent from [facility] for concern of septic arthritis.  [facility], hasn't been on antibiotics. Has been given ibuprofen.    No fevers.   Physical Exam  BP (!) 146/88 (BP Location: Right Arm)   Pulse 78   Temp 98 F (36.7 C) (Oral)   Resp 16   SpO2 98%  Patient is awake and alert, generally well appearing in no acute distress.  Vitals reviewed.  Speech is not slurred, able to answer questions with out difficulty.  Limited skin exam: multiple wounds on left lower extremity.  No redness over ankle or knee joint on limited exam.  Patient is able to range left knee and ankle but notes pain when he does.   Medical Decision Making  Medically screening exam initiated at 3:11 PM.  Appropriate orders placed.  Rushie Chestnut II was informed that the remainder of the evaluation will be completed by another provider, this initial triage assessment does not replace that evaluation, and the importance of remaining in the ED until their evaluation is complete.  Clinical Impression  LLE wounds   Cristina Gong, PA-C 07/08/20 1517

## 2020-07-08 NOTE — ED Notes (Signed)
Returned from MRI 
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
## 2020-07-08 NOTE — ED Triage Notes (Signed)
Pt sent from the [facility] for eval of possible septic arthritis. Swelling, blisters, and pain to L ankle x 3 weeks. Multiple ER visits and given only ibuprofen so was sent here for further eval.

## 2020-07-09 ENCOUNTER — Encounter (HOSPITAL_COMMUNITY): Payer: Self-pay | Admitting: Internal Medicine

## 2020-07-09 DIAGNOSIS — S80829A Blister (nonthermal), unspecified lower leg, initial encounter: Secondary | ICD-10-CM | POA: Diagnosis present

## 2020-07-09 DIAGNOSIS — L03116 Cellulitis of left lower limb: Secondary | ICD-10-CM | POA: Diagnosis present

## 2020-07-09 DIAGNOSIS — Z8249 Family history of ischemic heart disease and other diseases of the circulatory system: Secondary | ICD-10-CM | POA: Diagnosis not present

## 2020-07-09 DIAGNOSIS — Z6841 Body Mass Index (BMI) 40.0 and over, adult: Secondary | ICD-10-CM | POA: Diagnosis not present

## 2020-07-09 DIAGNOSIS — M86162 Other acute osteomyelitis, left tibia and fibula: Secondary | ICD-10-CM | POA: Diagnosis not present

## 2020-07-09 DIAGNOSIS — I10 Essential (primary) hypertension: Secondary | ICD-10-CM

## 2020-07-09 DIAGNOSIS — M869 Osteomyelitis, unspecified: Secondary | ICD-10-CM

## 2020-07-09 DIAGNOSIS — K219 Gastro-esophageal reflux disease without esophagitis: Secondary | ICD-10-CM | POA: Diagnosis present

## 2020-07-09 DIAGNOSIS — Z79899 Other long term (current) drug therapy: Secondary | ICD-10-CM | POA: Diagnosis not present

## 2020-07-09 DIAGNOSIS — T24232A Burn of second degree of left lower leg, initial encounter: Secondary | ICD-10-CM | POA: Diagnosis not present

## 2020-07-09 DIAGNOSIS — Z833 Family history of diabetes mellitus: Secondary | ICD-10-CM | POA: Diagnosis not present

## 2020-07-09 DIAGNOSIS — S82402A Unspecified fracture of shaft of left fibula, initial encounter for closed fracture: Principal | ICD-10-CM | POA: Diagnosis present

## 2020-07-09 DIAGNOSIS — S81802A Unspecified open wound, left lower leg, initial encounter: Secondary | ICD-10-CM

## 2020-07-09 DIAGNOSIS — Z823 Family history of stroke: Secondary | ICD-10-CM | POA: Diagnosis not present

## 2020-07-09 DIAGNOSIS — L98499 Non-pressure chronic ulcer of skin of other sites with unspecified severity: Secondary | ICD-10-CM | POA: Diagnosis present

## 2020-07-09 DIAGNOSIS — X16XXXA Contact with hot heating appliances, radiators and pipes, initial encounter: Secondary | ICD-10-CM | POA: Diagnosis present

## 2020-07-09 DIAGNOSIS — S82425A Nondisplaced transverse fracture of shaft of left fibula, initial encounter for closed fracture: Secondary | ICD-10-CM

## 2020-07-09 DIAGNOSIS — Z888 Allergy status to other drugs, medicaments and biological substances status: Secondary | ICD-10-CM | POA: Diagnosis not present

## 2020-07-09 DIAGNOSIS — Z20822 Contact with and (suspected) exposure to covid-19: Secondary | ICD-10-CM | POA: Diagnosis present

## 2020-07-09 DIAGNOSIS — T24002A Burn of unspecified degree of unspecified site of left lower limb, except ankle and foot, initial encounter: Secondary | ICD-10-CM | POA: Diagnosis present

## 2020-07-09 DIAGNOSIS — R609 Edema, unspecified: Secondary | ICD-10-CM | POA: Diagnosis present

## 2020-07-09 LAB — HIV ANTIBODY (ROUTINE TESTING W REFLEX): HIV Screen 4th Generation wRfx: NONREACTIVE

## 2020-07-09 LAB — SARS CORONAVIRUS 2 (TAT 6-24 HRS): SARS Coronavirus 2: NEGATIVE

## 2020-07-09 LAB — SEDIMENTATION RATE: Sed Rate: 24 mm/hr — ABNORMAL HIGH (ref 0–16)

## 2020-07-09 LAB — CK: Total CK: 78 U/L (ref 49–397)

## 2020-07-09 MED ORDER — VANCOMYCIN HCL 10 G IV SOLR
1750.0000 mg | Freq: Two times a day (BID) | INTRAVENOUS | Status: DC
  Filled 2020-07-09 (×2): qty 1750

## 2020-07-09 MED ORDER — SODIUM CHLORIDE 0.9 % IV SOLN
8.0000 mg/kg | Freq: Every day | INTRAVENOUS | Status: DC
  Filled 2020-07-09: qty 15

## 2020-07-09 MED ORDER — PANTOPRAZOLE SODIUM 40 MG IV SOLR
40.0000 mg | INTRAVENOUS | Status: DC
  Administered 2020-07-09 – 2020-07-10 (×2): 40 mg via INTRAVENOUS
  Filled 2020-07-09 (×2): qty 40

## 2020-07-09 MED ORDER — ORITAVANCIN DIPHOSPHATE 400 MG IV SOLR
1200.0000 mg | Freq: Once | INTRAVENOUS | Status: AC
  Administered 2020-07-09: 1200 mg via INTRAVENOUS
  Filled 2020-07-09: qty 120

## 2020-07-09 MED ORDER — VANCOMYCIN HCL 2000 MG/400ML IV SOLN: 2000.0000 mg | INTRAVENOUS | Status: DC

## 2020-07-09 MED ORDER — PIPERACILLIN-TAZOBACTAM 3.375 G IVPB
3.3750 g | Freq: Three times a day (TID) | INTRAVENOUS | Status: DC
  Administered 2020-07-09: 3.375 g via INTRAVENOUS
  Filled 2020-07-09 (×2): qty 50

## 2020-07-09 MED ORDER — SODIUM CHLORIDE 0.9 % IV SOLN: 2.0000 g | INTRAVENOUS | Status: DC

## 2020-07-09 NOTE — Progress Notes (Signed)
Patient ID: Justin Greer, male   DOB: February 12, 1971, 50 y.o.   MRN: [id number] Patient admitted early this morning for left leg pain and was found to have left fibular fracture and left leg wound with cellulitis and has been started on IV antibiotics.  Orthopedics evaluation is pending.  I have seen and examined the patient at bedside and plan of care discussed with him.  I have reviewed patient's medical records including this morning's H&P, current vitals, medications, labs myself.  Continue current antibiotics.  Follow orthopedics recommendations.

## 2020-07-09 NOTE — Plan of Care (Signed)
  Problem: Activity: Goal: Risk for activity intolerance will decrease Outcome: Progressing   Problem: Pain Managment: Goal: General experience of comfort will improve Outcome: Progressing   Problem: Safety: Goal: Ability to remain free from injury will improve Outcome: Progressing   
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
## 2020-07-09 NOTE — Consult Note (Addendum)
ORTHOPAEDIC CONSULTATION  REQUESTING PHYSICIAN: Frankey Shown, DO  Chief Complaint: Left leg pain that started on March 12.  HPI: Justin Greer is a 50 y.o. male who presents with patient states he woke up with acute leg pain on March 12 he states he has been to the [facility] hospital several times was recommended to use heat he used a heating pad and the next day he noticed ulcerative blisters over the anterior aspect of his leg he returned to the [facility] hospital and referred to [facility] for inpatient treatment.  Patient did have an ultrasound that was negative for DVT.  Past Medical History:  Diagnosis Date  . GERD (gastroesophageal reflux disease)   . High blood pressure    Past Surgical History:  Procedure Laterality Date  . ADENOIDECTOMY    . BACK SURGERY  2016  . BUNIONECTOMY Right 2017  . FOOT SURGERY Right 2005  . lymphnode removal  1981  . SHOULDER SURGERY Left 2013  . TONSILLECTOMY     Social History   Socioeconomic History  . Marital status: Married    Spouse name: Not on file  . Number of children: Not on file  . Years of education: Not on file  . Highest education level: Not on file  Occupational History  . Not on file  Tobacco Use  . Smoking status: Never Smoker  . Smokeless tobacco: Never Used  Substance and Sexual Activity  . Alcohol use: Yes    Alcohol/week: 12.0 standard drinks    Types: 12 Cans of beer per week  . Drug use: No  . Sexual activity: Not on file  Other Topics Concern  . Not on file  Social History Narrative  . Not on file   Social Determinants of Health   Financial Resource Strain: Not on file  Food Insecurity: Not on file  Transportation Needs: Not on file  Physical Activity: Not on file  Stress: Not on file  Social Connections: Not on file   Family History  Problem Relation Age of Onset  . High blood pressure Mother   . Heart disease Mother   . Diabetes Mother   . Diabetes Father   . Heart disease Father   . High  blood pressure Father   . Stroke Father   . High blood pressure Brother    - negative except otherwise stated in the family history section Allergies  Allergen Reactions  . Lisinopril Cough  . Venlafaxine Swelling    Pt reports swelling in his legs.   . Sildenafil Other (See Comments)    Headache  . Amlodipine Cough and Swelling    Pt reports swelling in his legs.    Prior to Admission medications   Medication Sig Start Date End Date Taking? Authorizing Provider  diazepam (VALIUM) 10 MG tablet Take 10 mg by mouth 3 (three) times daily.   Yes [provider]  DULoxetine (CYMBALTA) 30 MG capsule Take 30 mg by mouth at bedtime.   Yes [provider]  DULoxetine (CYMBALTA) 60 MG capsule Take 60 mg by mouth daily.   Yes [provider]  Ergocalciferol (VITAMIN D2) 50 MCG (2000 UT) TABS Take 2,000 Units by mouth daily.   Yes [provider]  hydrALAZINE (APRESOLINE) 25 MG tablet Take 25 mg by mouth in the morning and at bedtime.   Yes [provider]  losartan (COZAAR) 100 MG tablet Take 10 mg by mouth daily.   Yes [provider]  methocarbamol (ROBAXIN) 500 MG tablet Take 250 mg by mouth 3 (three) times daily as needed for muscle spasms.   Yes [provider]  montelukast (SINGULAIR) 10 MG tablet Take 1 tablet (10 mg total) by mouth at bedtime. 03/04/16  Yes Kozlow, Alvira Philips, MD  pantoprazole (PROTONIX) 40 MG tablet Take 40 mg by mouth daily.   Yes [provider]  propranolol (INDERAL) 40 MG tablet Take 40 mg by mouth 2 (two) times daily.   Yes [provider]   DG Tibia/Fibula Left  Result Date: 07/08/2020 CLINICAL DATA:  Pain Infection EXAM: LEFT TIBIA AND FIBULA - 2 VIEW COMPARISON:  None. FINDINGS: Mild diffuse soft tissue swelling of the left lower leg. There is irregularity of the medial cortex of the mid fibular diaphysis which could represent osteomyelitis. IMPRESSION: Mild diffuse soft tissue swelling  of the left lower leg with cortical irregularity of the lateral cortex of the mid fibular diaphysis suspicious for osteomyelitis. Electronically Signed   By: Acquanetta Belling M.D.   On: 07/08/2020 16:13   MR TIBIA FIBULA LEFT WO CONTRAST  Result Date: 07/08/2020 CLINICAL DATA:  Soft tissue infection suspected EXAM: MRI OF LOWER LEFT EXTREMITY WITHOUT CONTRAST TECHNIQUE: Multiplanar, multisequence MR imaging of the left was performed. No intravenous contrast was administered. COMPARISON:  Radiograph same day FINDINGS: Bones/Joint/Cartilage There is a nondisplaced fracture seen through the mid fibula. Overlying periosteal reaction and diffuse soft tissue edema is seen. There is surrounding marrow edema seen throughout the midshaft of the fibula. The remainder of the osseous structures appear to be intact. No areas of cortical destruction or periosteal reaction. No large knee or ankle joint effusion is seen. Ligaments Suboptimally visualized Muscles and Tendons Mild edema surrounding the mid fibula. The muscles are otherwise intact is mild fatty atrophy. The visualized portion of the tendons are intact without tendinopathy or tear. Soft tissues Subcutaneous edema seen along the inferior medial aspect of the mid to distal lower extremity. IMPRESSION: Nondisplaced fracture through the mid fibula with surrounding periosteal reaction and significant marrow edema. There is also a subcutaneous and surrounding muscular edema. Electronically Signed   By: Jonna Clark M.D.   On: 07/08/2020 22:29   - pertinent xrays, CT, MRI studies were reviewed and independently interpreted  Positive ROS: All other systems have been reviewed and were otherwise negative with the exception of those mentioned in the HPI and as above.  Physical Exam: General: Alert, no acute distress Psychiatric: Patient is competent for consent with normal mood and affect Lymphatic: No axillary or cervical lymphadenopathy Cardiovascular: No pedal  edema Respiratory: No cyanosis, no use of accessory musculature GI: No organomegaly, abdomen is soft and non-tender    Images:  @ENCIMAGES @      Labs:  Lab Results  Component Value Date   REPTSTATUS PENDING 07/09/2020   CULT  07/09/2020    NO GROWTH < 12 HOURS Performed at [facility], [address], [place], [place]     Lab Results  Component Value Date   ALBUMIN 3.5 07/08/2020    No results found for: CBC  Neurologic: Patient does not have protective sensation bilateral lower extremities.   MUSCULOSKELETAL:   Skin: Examination patient has 4 symmetric ulcerative blisters on the mid tibia consistent where his heating pad was.  These are symmetric ulcers.  Patient has a strongly palpable dorsalis pedis and posterior tibial pulse the calf is soft nontender no evidence of any deep crepitation no pain with range of  motion of the ankle.  The ulcer that appeared consistent with a burn are symmetric and superficial.  Patient is point tender to palpation over the area of the fibular fracture.  Review of the radiographs shows bony changes of the mid fibula.  Reviewing of the MRI scan that I also reviewed with radiology shows a lucent line and edema around this area consistent with a stress fracture.  Patient denies any trauma.  There is edema around the area of the stress fracture there is no inflammation around the fascia there is no deep abscesses.  No clinical indication of a deep abscess or necrotizing fasciitis.  Assessment: Assessment: Burn left leg secondary to a heating pad with a stress fracture mid fibula on the left.  Plan: Plan: I will place orders for a compression stocking to be worn directly against the burns he is to change this daily I will follow-up in the office in 1 week.  I do not feel the patient needs any surgical intervention at this time does not need any long-term antibiotics.  Thank you for the consult and the opportunity to see Mr.  Delena Bali, MD [facility] [phone] 10:06 AM

## 2020-07-09 NOTE — Consult Note (Addendum)
[facility] Infectious Diseases                                                                                        Patient Identification: Patient Name: Justin Greer MRN: [id number] [place] Date: 07/08/2020  3:05 PM Today's Date: 07/09/2020 Reason for consult: Osteomyelitis  Requesting provider: Bernadette Hoit  Principal Problem:   Left fibular fracture Active Problems:   Left leg cellulitis   Leg osteomyelitis, left (HCC)   Leg wound, left   Obesity, Class III, BMI 40-49.9 (morbid obesity) ([place])   Essential hypertension   GERD (gastroesophageal reflux disease)   Antibiotics: Vancomycin 4/4 -                     Pip/Tazo -   Lines/Tubes: PIV  Assessment Left fibula osteomyelitis Nondisplaced fracture of left mid fibula No plans for surgical intervention per Ortho Blood cultures no growth in less than 12 hours  Recommendations  We will plan to change vancomycin to daptomycin for bacterial septic profile.  Will check CPK De-escalate Zosyn to ceftriaxone Baseline CRP and ESR Will plan for 6 weeks of antibiotics for osteomyelitis   Addendum: I initially planned for 6 weeks of IV antibiotics given Xray showed osteomyelitis of fibula. However, the acute history of left leg pain/wound ( 3+ weeks) does not correlate with Osteomyelitis. ESR is not as high as expected in osteomyelitis. I also spoke with radiologist regarding both Xray and MRI left leg and they said findings are more suggestive of stress related fracture than an osteomyelitis which are better assessed in the MRI than Xray. No concerns for deeper infection or bone infection from Ortho. Hence, I will plan to treat this as a SSTI with short course of antibiotics. I have communicated this to patient and pharmacy as well.  - Will follow blood cultures.  - Plan for oritavancin one dose when ready for discharge  - A follow up with [facility] has been  made on  4/12 at 3:30 pm   Rest of the management as per the primary team. Please call with questions or concerns.  Thank you for the consult  __________________________________________________________________________________________________________ HPI and Hospital Course: 50 year old male with past medical history of hypertension and GERD who presented to the ED today with complaints of left leg wound.  He follows with PA and complaint of left leg wound on March 15, his PCP did a ultrasound which ruled out blood clots.  He was given analgesics and was using heating pad to the lower leg which resulted in a blister and had to return back to the ED where the bullae were lanced.  He continued to take Medihoney and Silvadene on the lesions however the leg continued to be painful and was difficult for him to walk  and came to the ED for evaluation.  Denies any trauma to the left leg, insect bite or animal bite.  Denies being on any antibiotics prior to admit.  Has a prior history of surgery in the left ankle for ligament and tendon repair.  Denies any left ankle or left knee pain.  Denies smoking,  drinks alcohol occasionally and denies using drugs.  He also has 2 dogs at home.  Lives with his wife and 32 year old son and is under disability  At ED, he was afebrile, no leukocytosis.  Blood cultures 4/5 no growth in less than 24 hours X-ray left leg mild diffuse soft tissue swelling with cortical irregularity of the lateral cortex of the mid fibular diaphysis suspicious for osteomyelitis MRI left leg nondisplaced fracture through the mid fibula with surrounding periosteal reaction and significant marrow edema.  Subcutaneous and surrounding muscular edema  Seen by orthopedics, no plans for surgical intervention.   ROS: General- Denies fever, chills, loss of appetite and loss of weight HEENT - Denies headache, blurry vision, neck pain, sinus pain Chest - Denies any chest pain, SOB or cough CVS- Denies any  dizziness/lightheadedness, syncopal attacks, palpitations Abdomen- Denies any nausea, vomiting, abdominal pain, hematochezia and diarrhea Neuro - Denies any weakness, numbness, tingling sensation Psych - Denies any changes in mood irritability or depressive symptoms GU- Denies any burning, dysuria, hematuria or increased frequency of urination Skin - superficial open blisters in the left leg  MSK - denies any joint pain/swelling or restricted ROM   Past Medical History:  Diagnosis Date  . GERD (gastroesophageal reflux disease)   . High blood pressure    Past Surgical History:  Procedure Laterality Date  . ADENOIDECTOMY    . BACK SURGERY  2016  . BUNIONECTOMY Right 2017  . FOOT SURGERY Right 2005  . lymphnode removal  1981  . SHOULDER SURGERY Left 2013  . TONSILLECTOMY      Scheduled Meds: . pantoprazole (PROTONIX) IV  40 mg Intravenous Q24H   Continuous Infusions: . piperacillin-tazobactam (ZOSYN)  IV Stopped (07/09/20 0941)  . [START ON 07/10/2020] vancomycin     PRN Meds:.morphine injection  Allergies  Allergen Reactions  . Lisinopril Cough  . Venlafaxine Swelling    Pt reports swelling in his legs.   . Sildenafil Other (See Comments)    Headache  . Amlodipine Cough and Swelling    Pt reports swelling in his legs.    Social History   Socioeconomic History  . Marital status: Married    Spouse name: Not on file  . Number of children: Not on file  . Years of education: Not on file  . Highest education level: Not on file  Occupational History  . Not on file  Tobacco Use  . Smoking status: Never Smoker  . Smokeless tobacco: Never Used  Substance and Sexual Activity  . Alcohol use: Yes    Alcohol/week: 12.0 standard drinks    Types: 12 Cans of beer per week  . Drug use: No  . Sexual activity: Not on file  Other Topics Concern  . Not on file  Social History Narrative  . Not on file   Social Determinants of Health   Financial Resource Strain: Not on file   Food Insecurity: Not on file  Transportation Needs: Not on file  Physical Activity: Not on file  Stress: Not on file  Social Connections: Not on file  Intimate Partner Violence: Not on file    Vitals BP (!) 162/88 (BP Location: Right Arm)   Pulse 60   Temp 98.8 F (37.1 C) (Oral)   Resp 16   Ht 5' 10"  (1.778 m)   Wt 129.3 kg   SpO2 100%   BMI 40.89 kg/m    Physical Exam Constitutional:   Not in acute distress, appears comfortable    Comments:  Cardiovascular:     Rate and Rhythm: Normal rate and regular rhythm.     Heart sounds: No murmur heard.   Pulmonary:     Effort: Pulmonary effort is normal.     Comments: Clear air sounds bilaterally  Abdominal:     Palpations: Abdomen is soft.     Tenderness: Nontender  Musculoskeletal:        General: No swelling or tenderness.     Skin:    Comments: as above   Neurological:     General: No focal deficit present.   Psychiatric:        Mood and Affect: Mood normal.    Pertinent Microbiology Results for orders placed or performed during the hospital encounter of 07/08/20  Culture, blood (routine x 2)     Status: None (Preliminary result)   Collection Time: 07/09/20 12:10 AM   Specimen: BLOOD  Result Value Ref Range Status   Specimen Description BLOOD RIGHT ANTECUBITAL  Final   Special Requests   Final    BOTTLES DRAWN AEROBIC AND ANAEROBIC Blood Culture adequate volume   Culture   Final    NO GROWTH < 12 HOURS Performed at [facility], [place] [address], [place], [place] [address]    Report Status PENDING  Incomplete  Culture, blood (routine x 2)     Status: None (Preliminary result)   Collection Time: 07/09/20 12:15 AM   Specimen: BLOOD LEFT HAND  Result Value Ref Range Status   Specimen Description BLOOD LEFT HAND  Final   Special Requests   Final    BOTTLES DRAWN AEROBIC AND ANAEROBIC Blood Culture adequate volume   Culture   Final    NO GROWTH < 12 HOURS Performed at [facility],  [place] [address] [place], [place] [address]    Report Status PENDING  Incomplete  SARS CORONAVIRUS 2 (TAT 6-24 HRS) Nasopharyngeal Nasopharyngeal Swab     Status: None   Collection Time: 07/09/20 12:41 AM   Specimen: Nasopharyngeal Swab  Result Value Ref Range Status   SARS Coronavirus 2 NEGATIVE NEGATIVE Final    Comment: (NOTE) SARS-CoV-2 target nucleic acids are NOT DETECTED.  The SARS-CoV-2 RNA is generally detectable in upper and lower respiratory specimens during the acute phase of infection. Negative results do not preclude SARS-CoV-2 infection, do not rule out co-infections with other pathogens, and should not be used as the sole basis for treatment or other patient management decisions. Negative results must be combined with clinical observations, patient history, and epidemiological information. The expected result is Negative.  Fact Sheet for Patients: SugarRoll.be  Fact Sheet for Healthcare Providers: https://www.woods-mathews.com/  This test is not yet approved or cleared by the [place] FDA and  has been authorized for detection and/or diagnosis of SARS-CoV-2 by FDA under an Emergency Use Authorization (EUA). This EUA will remain  in effect (meaning this test can be used) for the duration of the COVID-19 declaration under Se ction 564(b)(1) of the Act, 21 U.S.C. section 360bbb-3(b)(1), unless the authorization is terminated or revoked sooner.  Performed at [facility], [place] [address], [place], [place] [address]       Pertinent Lab seen by me: CBC Latest Ref Rng & Units 07/08/2020  WBC 4.0 - 10.5 K/uL 5.9  Hemoglobin 13.0 - 17.0 g/dL 13.7  Hematocrit 39.0 - 52.0 % 40.1  Platelets 150 - 400 K/uL 216   CMP Latest Ref Rng & Units 07/08/2020  Glucose 70 - 99  mg/dL 84  BUN 6 - 20 mg/dL 14  Creatinine 0.61 - 1.24 mg/dL 1.33(H)  Sodium 135 - 145 mmol/L 134(L)  Potassium 3.5 - 5.1 mmol/L 4.5  Chloride 98 - 111 mmol/L  97(L)  CO2 22 - 32 mmol/L 28  Calcium 8.9 - 10.3 mg/dL 9.3  Total Protein 6.5 - 8.1 g/dL 7.1  Total Bilirubin 0.3 - 1.2 mg/dL 0.9  Alkaline Phos 38 - 126 U/L 118  AST 15 - 41 U/L 54(H)  ALT 0 - 44 U/L 42     Pertinent Imagings/Other Imagings Plain films and CT images have been personally visualized and interpreted; radiology reports have been reviewed. Decision making incorporated into the Impression / Recommendations.  MRI left leg 07/08/20 FINDINGS: Bones/Joint/Cartilage  There is a nondisplaced fracture seen through the mid fibula. Overlying periosteal reaction and diffuse soft tissue edema is seen. There is surrounding marrow edema seen throughout the midshaft of the fibula. The remainder of the osseous structures appear to be intact. No areas of cortical destruction or periosteal reaction. No large knee or ankle joint effusion is seen.  Ligaments  Suboptimally visualized  Muscles and Tendons  Mild edema surrounding the mid fibula. The muscles are otherwise intact is mild fatty atrophy. The visualized portion of the tendons are intact without tendinopathy or tear.  Soft tissues  Subcutaneous edema seen along the inferior medial aspect of the mid to distal lower extremity.  IMPRESSION: Nondisplaced fracture through the mid fibula with surrounding periosteal reaction and significant marrow edema. There is also a subcutaneous and surrounding muscular edema.  Left Leg Xray 07/08/20 FINDINGS: Mild diffuse soft tissue swelling of the left lower leg. There is irregularity of the medial cortex of the mid fibular diaphysis which could represent osteomyelitis.  IMPRESSION: Mild diffuse soft tissue swelling of the left lower leg with cortical irregularity of the lateral cortex of the mid fibular diaphysis suspicious for osteomyelitis.  I have spent 60 minutes for this patient encounter including review of prior medical records with greater than 50% of time being  face to face and coordination of their care.  Electronically signed by:   Rosiland Oz, MD Infectious Disease Physician [facility] for Infectious Disease Pager: [phone]

## 2020-07-09 NOTE — Progress Notes (Signed)
Pharmacy Antibiotic Note  Justin Greer is a 50 y.o. male admitted on 07/08/2020 with osteomyelitis.  Pharmacy has been consulted for vancomycin and zosyn dosing. Vanc 2500 mg and zosyn 3.375 gm given in ED  Plan: Zosyn 3.375g IV q8h (4 hour infusion).  Vancomycin 1750 mg IV q12 hours Fg/u renal function, cultures and clinical course  Height: 5\' 10"  (177.8 cm) Weight: 129.3 kg (285 lb) IBW/kg (Calculated) : 73  Temp (24hrs), Avg:97.9 F (36.6 C), Min:97.7 F (36.5 C), Max:98 F (36.7 C)  Recent Labs  Lab 07/08/20 1516  WBC 5.9  CREATININE 1.33*    Estimated Creatinine Clearance: 91.8 mL/min (A) (by C-G formula based on SCr of 1.33 mg/dL (H)).    Allergies  Allergen Reactions  . Lisinopril Cough  . Venlafaxine Swelling    Pt reports swelling in his legs.   . Sildenafil Other (See Comments)    Headache  . Amlodipine Cough and Swelling    Pt reports swelling in his legs.     Thank you for allowing pharmacy to be a part of this patient's care.  09/07/20 07/09/2020 5:37 AM

## 2020-07-09 NOTE — Consult Note (Signed)
Reason for Consult:Left fibula osteo Referring Physician: Geannie Risen Time called: [id number] Time at bedside: 0900   Justin Greer is an 51 y.o. male.  HPI: Justin Greer woke from sleep about 3 weeks ago with significant left lower leg pain. He does not recall any antecedent event. He went to see his PCP at the [place] who r/o DVT and rx heat and NSAIDS. A few days later he developed some bullae on the medial portion of his leg. He's unsure if he may have burned himself with the heating pad or this was unrelated. He returned to the [place] where they drained the bullae and were treating the wounds locally. The pain continued to worsen and he developed another half dozen or so lesions and he was directed to come to the ED for evaluation. He denies any prior hx/o similar.He denies any fevers, chills, sweats, N/V. 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 He is on service-connected disability for numerous issues.  Past Medical History:  Diagnosis Date  . GERD (gastroesophageal reflux disease)   . High blood pressure     Past Surgical History:  Procedure Laterality Date  . ADENOIDECTOMY    . BACK SURGERY  2016  . BUNIONECTOMY Right 2017  . FOOT SURGERY Right 2005  . lymphnode removal  1981  . SHOULDER SURGERY Left 2013  . TONSILLECTOMY      Family History  Problem Relation Age of Onset  . High blood pressure Mother   . Heart disease Mother   . Diabetes Mother   . Diabetes Father   . Heart disease Father   . High blood pressure Father   . Stroke Father   . High blood pressure Brother     Social History:  reports that he has never smoked. He has never used smokeless tobacco. He reports current alcohol use of about 12.0 standard drinks of alcohol per week. He reports that he does not use drugs.  Allergies:  Allergies  Allergen Reactions  . Lisinopril Cough  . Venlafaxine Swelling    Pt reports swelling in his legs.   . Sildenafil Other (See  Comments)    Headache  . Amlodipine Cough and Swelling    Pt reports swelling in his legs.     Medications: I have reviewed the patient's current medications.  Results for orders placed or performed during the hospital encounter of 07/08/20 (from the past 48 hour(s))  CBC with Differential     Status: None   Collection Time: 07/08/20  3:16 PM  Result Value Ref Range   WBC 5.9 4.0 - 10.5 K/uL   RBC 4.32 4.22 - 5.81 MIL/uL   Hemoglobin 13.7 13.0 - 17.0 g/dL   HCT 00.1 74.9 - 44.9 %   MCV 92.8 80.0 - 100.0 fL   MCH 31.7 26.0 - 34.0 pg   MCHC 34.2 30.0 - 36.0 g/dL   RDW 67.5 91.6 - 38.4 %   Platelets 216 150 - 400 K/uL   nRBC 0.0 0.0 - 0.2 %   Neutrophils Relative % 69 %   Neutro Abs 4.0 1.7 - 7.7 K/uL   Lymphocytes Relative 17 %   Lymphs Abs 1.0 0.7 - 4.0 K/uL   Monocytes Relative 11 %   Monocytes Absolute 0.7 0.1 - 1.0 K/uL   Eosinophils Relative 1 %   Eosinophils Absolute 0.1 0.0 - 0.5 K/uL   Basophils Relative 2 %   Basophils Absolute 0.1 0.0 -  0.1 K/uL   Immature Granulocytes 0 %   Abs Immature Granulocytes 0.02 0.00 - 0.07 K/uL    Comment: Performed at [facility], [address], [place], [place] [address]  Comprehensive metabolic panel     Status: Abnormal   Collection Time: 07/08/20  3:16 PM  Result Value Ref Range   Sodium 134 (L) 135 - 145 mmol/L   Potassium 4.5 3.5 - 5.1 mmol/L   Chloride 97 (L) 98 - 111 mmol/L   CO2 28 22 - 32 mmol/L   Glucose, Bld 84 70 - 99 mg/dL    Comment: Glucose reference range applies only to samples taken after fasting for at least 8 hours.   BUN 14 6 - 20 mg/dL   Creatinine, Ser 1.88 (H) 0.61 - 1.24 mg/dL   Calcium 9.3 8.9 - 41.6 mg/dL   Total Protein 7.1 6.5 - 8.1 g/dL   Albumin 3.5 3.5 - 5.0 g/dL   AST 54 (H) 15 - 41 U/L   ALT 42 0 - 44 U/L   Alkaline Phosphatase 118 38 - 126 U/L   Total Bilirubin 0.9 0.3 - 1.2 mg/dL   GFR, Estimated >60 >63 mL/min    Comment: (NOTE) Calculated using the CKD-EPI Creatinine Equation  (2021)    Anion gap 9 5 - 15    Comment: Performed at [facility], [address], [place], [place] [address]  SARS CORONAVIRUS 2 (TAT 6-24 HRS) Nasopharyngeal Nasopharyngeal Swab     Status: None   Collection Time: 07/09/20 12:41 AM   Specimen: Nasopharyngeal Swab  Result Value Ref Range   SARS Coronavirus 2 NEGATIVE NEGATIVE    Comment: (NOTE) SARS-CoV-2 target nucleic acids are NOT DETECTED.  The SARS-CoV-2 RNA is generally detectable in upper and lower respiratory specimens during the acute phase of infection. Negative results do not preclude SARS-CoV-2 infection, do not rule out co-infections with other pathogens, and should not be used as the sole basis for treatment or other patient management decisions. Negative results must be combined with clinical observations, patient history, and epidemiological information. The expected result is Negative.  Fact Sheet for Patients: HairSlick.no  Fact Sheet for Healthcare Providers: quierodirigir.com  This test is not yet approved or cleared by the [place] FDA and  has been authorized for detection and/or diagnosis of SARS-CoV-2 by FDA under an Emergency Use Authorization (EUA). This EUA will remain  in effect (meaning this test can be used) for the duration of the COVID-19 declaration under Se ction 564(b)(1) of the Act, 21 U.S.C. section 360bbb-3(b)(1), unless the authorization is terminated or revoked sooner.  Performed at [facility], [address], [place], [place] [address]     DG Tibia/Fibula Left  Result Date: 07/08/2020 CLINICAL DATA:  Pain Infection EXAM: LEFT TIBIA AND FIBULA - 2 VIEW COMPARISON:  None. FINDINGS: Mild diffuse soft tissue swelling of the left lower leg. There is irregularity of the medial cortex of the mid fibular diaphysis which could represent osteomyelitis. IMPRESSION: Mild diffuse soft tissue swelling of the left lower leg  with cortical irregularity of the lateral cortex of the mid fibular diaphysis suspicious for osteomyelitis. Electronically Signed   By: Acquanetta Belling M.D.   On: 07/08/2020 16:13   MR TIBIA FIBULA LEFT WO CONTRAST  Result Date: 07/08/2020 CLINICAL DATA:  Soft tissue infection suspected EXAM: MRI OF LOWER LEFT EXTREMITY WITHOUT CONTRAST TECHNIQUE: Multiplanar, multisequence MR imaging of the left was performed. No intravenous contrast was administered.  COMPARISON:  Radiograph same day FINDINGS: Bones/Joint/Cartilage There is a nondisplaced fracture seen through the mid fibula. Overlying periosteal reaction and diffuse soft tissue edema is seen. There is surrounding marrow edema seen throughout the midshaft of the fibula. The remainder of the osseous structures appear to be intact. No areas of cortical destruction or periosteal reaction. No large knee or ankle joint effusion is seen. Ligaments Suboptimally visualized Muscles and Tendons Mild edema surrounding the mid fibula. The muscles are otherwise intact is mild fatty atrophy. The visualized portion of the tendons are intact without tendinopathy or tear. Soft tissues Subcutaneous edema seen along the inferior medial aspect of the mid to distal lower extremity. IMPRESSION: Nondisplaced fracture through the mid fibula with surrounding periosteal reaction and significant marrow edema. There is also a subcutaneous and surrounding muscular edema. Electronically Signed   By: Jonna Clark M.D.   On: 07/08/2020 22:29    Review of Systems  Constitutional: Negative for chills, diaphoresis and fever.  HENT: Negative for ear discharge, ear pain, hearing loss and tinnitus.   Eyes: Negative for photophobia and pain.  Respiratory: Negative for cough and shortness of breath.   Cardiovascular: Negative for chest pain.  Gastrointestinal: Negative for abdominal pain, nausea and vomiting.  Genitourinary: Negative for dysuria, flank pain, frequency and urgency.   Musculoskeletal: Positive for arthralgias (Left leg). Negative for back pain, myalgias and neck pain.  Neurological: Negative for dizziness and headaches.  Hematological: Does not bruise/bleed easily.  Psychiatric/Behavioral: The patient is not nervous/anxious.    Blood pressure 137/81, pulse 63, temperature 97.9 F (36.6 C), temperature source Oral, resp. rate 16, height 5\' 10"  (1.778 m), weight 129.3 kg, SpO2 93 %. Physical Exam Constitutional:      General: He is not in acute distress.    Appearance: He is well-developed. He is not diaphoretic.  HENT:     Head: Normocephalic and atraumatic.  Eyes:     General: No scleral icterus.       Right eye: No discharge.        Left eye: No discharge.     Conjunctiva/sclera: Conjunctivae normal.  Cardiovascular:     Rate and Rhythm: Normal rate and regular rhythm.  Pulmonary:     Effort: Pulmonary effort is normal. No respiratory distress.  Musculoskeletal:     Cervical back: Normal range of motion.     Comments: LLE No traumatic wounds, ecchymosis, or rash. Multiple ulcerative lesions circumferentially distal third lower leg.  Mildly TTP  No knee or ankle effusion  Knee stable to varus/ valgus and anterior/posterior stress  Sens DPN, SPN, TN intact  Motor EHL, ext, flex, evers 5/5  DP 2+, PT 0, No significant edema   Skin:    General: Skin is warm and dry.  Neurological:     Mental Status: He is alert.  Psychiatric:        Behavior: Behavior normal.     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 Given age and relative health would recommend ID consult for possible medical treatment. Would also recommend biopsy of the lesions (GS usually performs these). He may WBAT but will provide CAM walker for support.    [name], PA-C Orthopedic Surgery [phone] 07/09/2020, 9:11 AM

## 2020-07-09 NOTE — ED Notes (Signed)
Cheese, saltines and sprite provided. No [place] sandwiches in the dept.

## 2020-07-09 NOTE — Progress Notes (Signed)
NCM called [facility] hr notification hotline to inform of hospital stay. NCM spoke with [place], [phone]. Gae Gallop RN,BSN,CM

## 2020-07-09 NOTE — ED Provider Notes (Signed)
Discussed with Dr. Thomes Dinning for admission to the hospitalist   Zadie Rhine, MD 07/09/20 [phone]

## 2020-07-09 NOTE — Evaluation (Signed)
Physical Therapy Evaluation - One Time Patient Details Name: Justin Greer MRN: [id number] DOB: 08-Dec-1970 Today's Date: 07/09/2020   History of Present Illness  50 yo male admitted to [facility] on 4/5 with acute LLE pain x3 weeks. Left tibia and fibula x-ray showed mild diffuse soft tissue swelling of the left lower leg with cortical irregularity of the lateral cortex of the mid fibular diaphysis suspicious for osteomyelitis. MRI shows nondisplaced fx through midfibula (per ortho, stress fracture) with surround periosteal reaction and marrow edema. PMH includes adenoidectomy, HTN, GERD, R bunionectomy, L shoulder surgery.  Clinical Impression   Pt presents with [facility] strength, mobility, and activity tolerance, pt limited only by LLE pain. PT assisted pt in donning both compression sock and CAM boot, pt ambulatory in hallway without AD and mod I level for increased time and slightly antalgic gait. PT encouraged pt to wear a sneaker on RLE when wearing cam boot to diminish leg length discrepancy, pt understands. Pt with no PT needs, will sign off.      Follow Up Recommendations No PT follow up    Equipment Recommendations  None recommended by PT    Recommendations for Other Services       Precautions / Restrictions Precautions Precautions: None Precaution Comments: compression sock on LLE Required Braces or Orthoses: Other Brace Other Brace: CAM boot, WBAT Restrictions Weight Bearing Restrictions: No      Mobility  Bed Mobility Overal bed mobility: Independent Bed Mobility: Supine to Sit     Supine to sit: Independent     General bed mobility comments: no physical assist    Transfers Overall transfer level: Modified independent Equipment used: None             General transfer comment: mod I for increased time to rise and self-steady  Ambulation/Gait Ambulation/Gait assistance: Modified independent (Device/Increase time) Gait Distance (Feet): 250 Feet Assistive  device: None Gait Pattern/deviations: Step-through pattern;Decreased stride length;Antalgic Gait velocity: decr   General Gait Details: mod I for increased time, antalgic gait secondary to LLE.  Stairs            Wheelchair Mobility    Modified Rankin (Stroke Patients Only)       Balance Overall balance assessment: Independent                                           Pertinent Vitals/Pain Pain Assessment: 0-10 Pain Score: 3  Pain Location: L lower leg Pain Descriptors / Indicators: Sore Pain Intervention(s): Limited activity within patient's tolerance;Monitored during session;Premedicated before session;Repositioned    Home Living Family/patient expects to be discharged to:: Private residence Living Arrangements: Spouse/significant other Available Help at Discharge: Family Type of Home: House Home Access: Stairs to enter Entrance Stairs-Rails: None Secretary/administrator of Steps: 3 Home Layout: One level Home Equipment: Crutches      Prior Function Level of Independence: Independent         Comments: Pt does not work, he is on disability     Hand Dominance   Dominant Hand: Right    Extremity/Trunk Assessment   Upper Extremity Assessment Upper Extremity Assessment: Overall WFL for tasks assessed    Lower Extremity Assessment Lower Extremity Assessment: Overall WFL for tasks assessed LLE Deficits / Details: limited by pain, L lower leg edematous LLE: Unable to fully assess due to pain    Cervical / Trunk  Assessment Cervical / Trunk Assessment: Normal  Communication   Communication: No difficulties  Cognition Arousal/Alertness: Awake/alert Behavior During Therapy: WFL for tasks assessed/performed Overall Cognitive Status: Within Functional Limits for tasks assessed                                        General Comments General comments (skin integrity, edema, etc.): LE wounds covered with foam dressings,  compression sock over bandages with PT assist    Exercises     Assessment/Plan    PT Assessment Patent does not need any further PT services  PT Problem List         PT Treatment Interventions      PT Goals (Current goals can be found in the Care Plan section)  Acute Rehab PT Goals Patient Stated Goal: home PT Goal Formulation: With patient Time For Goal Achievement: 07/09/20 Potential to Achieve Goals: Good    Frequency     Barriers to discharge        Co-evaluation               AM-PAC PT "6 Clicks" Mobility  Outcome Measure Help needed turning from your back to your side while in a flat bed without using bedrails?: None Help needed moving from lying on your back to sitting on the side of a flat bed without using bedrails?: None Help needed moving to and from a bed to a chair (including a wheelchair)?: None Help needed standing up from a chair using your arms (e.g., wheelchair or bedside chair)?: None Help needed to walk in hospital room?: None Help needed climbing 3-5 steps with a railing? : A Little (pt declines practicing steps today) 6 Click Score: 23    End of Session Equipment Utilized During Treatment: Other (comment) (L CAM boot) Activity Tolerance: Patient tolerated treatment well Patient left: in chair;with call bell/phone within reach Nurse Communication: Mobility status PT Visit Diagnosis: Other abnormalities of gait and mobility (R26.89)    Time: 8101-7510 PT Time Calculation (min) (ACUTE ONLY): 20 min   Charges:   PT Evaluation $PT Eval Low Complexity: 1 Low         Justin Greer S, PT Acute Rehabilitation Services Pager [phone]  Office [phone]   Justin Greer 07/09/2020, 5:44 PM

## 2020-07-09 NOTE — ED Notes (Signed)
Breakfast orders placed 

## 2020-07-10 ENCOUNTER — Other Ambulatory Visit (HOSPITAL_COMMUNITY): Payer: Self-pay

## 2020-07-10 DIAGNOSIS — S82425A Nondisplaced transverse fracture of shaft of left fibula, initial encounter for closed fracture: Secondary | ICD-10-CM

## 2020-07-10 DIAGNOSIS — S81802A Unspecified open wound, left lower leg, initial encounter: Secondary | ICD-10-CM

## 2020-07-10 DIAGNOSIS — I10 Essential (primary) hypertension: Secondary | ICD-10-CM

## 2020-07-10 DIAGNOSIS — K219 Gastro-esophageal reflux disease without esophagitis: Secondary | ICD-10-CM

## 2020-07-10 LAB — COMPREHENSIVE METABOLIC PANEL
ALT: 41 U/L (ref 0–44)
AST: 52 U/L — ABNORMAL HIGH (ref 15–41)
Albumin: 3.2 g/dL — ABNORMAL LOW (ref 3.5–5.0)
Alkaline Phosphatase: 100 U/L (ref 38–126)
Anion gap: 6 (ref 5–15)
BUN: 13 mg/dL (ref 6–20)
CO2: 30 mmol/L (ref 22–32)
Calcium: 8.9 mg/dL (ref 8.9–10.3)
Chloride: 100 mmol/L (ref 98–111)
Creatinine, Ser: 1.38 mg/dL — ABNORMAL HIGH (ref 0.61–1.24)
GFR, Estimated: >60 mL/min (ref >60)
Glucose, Bld: 100 mg/dL — ABNORMAL HIGH (ref 70–99)
Potassium: 4.4 mmol/L (ref 3.5–5.1)
Sodium: 136 mmol/L (ref 135–145)
Total Bilirubin: 1.1 mg/dL (ref 0.3–1.2)
Total Protein: 6.4 g/dL — ABNORMAL LOW (ref 6.5–8.1)

## 2020-07-10 LAB — CBC
HCT: 36.6 % — ABNORMAL LOW (ref 39.0–52.0)
Hemoglobin: 12.5 g/dL — ABNORMAL LOW (ref 13.0–17.0)
MCH: 32 pg (ref 26.0–34.0)
MCHC: 34.2 g/dL (ref 30.0–36.0)
MCV: 93.6 fL (ref 80.0–100.0)
Platelets: 178 10*3/uL (ref 150–400)
RBC: 3.91 MIL/uL — ABNORMAL LOW (ref 4.22–5.81)
RDW: 13.1 % (ref 11.5–15.5)
WBC: 4.3 10*3/uL (ref 4.0–10.5)
nRBC: 0 % (ref 0.0–0.2)

## 2020-07-10 LAB — PROTIME-INR
INR: 1 (ref 0.8–1.2)
Prothrombin Time: 12.8 seconds (ref 11.4–15.2)

## 2020-07-10 LAB — APTT: aPTT: 40 seconds — ABNORMAL HIGH (ref 24–36)

## 2020-07-10 LAB — C-REACTIVE PROTEIN: CRP: 1.2 mg/dL — ABNORMAL HIGH (ref <1.0)

## 2020-07-10 LAB — MAGNESIUM: Magnesium: 1.9 mg/dL (ref 1.7–2.4)

## 2020-07-10 MED ORDER — OXYCODONE HCL 5 MG PO TABS: 5.0000 mg | ORAL_TABLET | Freq: Three times a day (TID) | ORAL | 0 refills | Status: DC | PRN

## 2020-07-10 NOTE — Discharge Summary (Signed)
Physician Discharge Summary  Justin Greer YYT:[id number] DOB: 28-Jul-1970 DOA: 07/08/2020  PCP: [name], PA-C  Admit date: 07/08/2020 Discharge date: 07/10/2020  Admitted From: Home Disposition: Home  Recommendations for Outpatient Follow-up:  1. Follow up with PCP in 1 week with repeat CBC/BMP 2. Outpatient follow-up with Dr. [name] 3. Outpatient follow-up with ID 4. Follow up in ED if symptoms worsen or new appear   Home Health: No Equipment/Devices: None  Discharge Condition: Stable CODE STATUS: Full Diet recommendation: Heart healthy  Brief/Interim Summary: 50 year old male with history of hypertension, GERD presented with worsening left leg wounds which got worse after using heating pad/Medihoney and Silvadene at home.  On presentation, MRI of lower extremity without contrast showed nondisplaced fracture through the mid fibula with surrounding periosteal reaction and significant marrow edema along with subcutaneous and surrounding muscular edema.  He was empirically started on IV antibiotics.  ID and orthopedics were consulted.  Orthopedics recommended compression stockings and recommended no surgical intervention with outpatient follow-up with orthopedics and no need for any long-term antibiotics.  Patient was given 1 dose of IV oritavancin by ID.  He will be discharged home today with outpatient follow-up with orthopedics.  Discharge Diagnoses:   Left fibular fracture Left leg wounds, most likely secondary to burn from heating pad as per orthopedics -MRI of lower extremity without contrast showed nondisplaced fracture through the mid fibula with surrounding periosteal reaction and significant marrow edema along with subcutaneous and surrounding muscular edema.  -Patient was initially started on IV antibiotics -Dr. [name] evaluated the patient: ecommended compression stockings and recommended no surgical intervention with outpatient follow-up with orthopedics and no need for any  long-term antibiotics.  -Patient was given 1 dose of IV oritavancin by ID. -He has tolerated PT well.  He will be discharged home today with outpatient follow-up with orthopedics.  Essential hypertension -Continue home regimen.  Outpatient follow-up  GERD -Continue PPI  Morbid obesity -Outpatient follow-up  Discharge Instructions  Discharge Instructions    Diet - low sodium heart healthy   Complete by: As directed    Increase activity slowly   Complete by: As directed      Allergies as of 07/10/2020      Reactions   Lisinopril Cough   Venlafaxine Swelling   Pt reports swelling in his legs.   Sildenafil Other (See Comments)   Headache   Amlodipine Cough, Swelling   Pt reports swelling in his legs.      Medication List    TAKE these medications   diazepam 10 MG tablet Commonly known as: VALIUM Take 10 mg by mouth 3 (three) times daily.   DULoxetine 60 MG capsule Commonly known as: CYMBALTA Take 60 mg by mouth daily.   DULoxetine 30 MG capsule Commonly known as: CYMBALTA Take 30 mg by mouth at bedtime.   hydrALAZINE 25 MG tablet Commonly known as: APRESOLINE Take 25 mg by mouth in the morning and at bedtime.   losartan 100 MG tablet Commonly known as: COZAAR Take 10 mg by mouth daily.   methocarbamol 500 MG tablet Commonly known as: ROBAXIN Take 250 mg by mouth 3 (three) times daily as needed for muscle spasms.   montelukast 10 MG tablet Commonly known as: SINGULAIR Take 1 tablet (10 mg total) by mouth at bedtime.   oxyCODONE 5 MG immediate release tablet Commonly known as: Roxicodone Take 1 tablet (5 mg total) by mouth every 8 (eight) hours as needed for severe pain.   pantoprazole 40  MG tablet Commonly known as: PROTONIX Take 40 mg by mouth daily.   propranolol 40 MG tablet Commonly known as: INDERAL Take 40 mg by mouth 2 (two) times daily.   Vitamin D2 50 MCG (2000 UT) Tabs Take 2,000 Units by mouth daily.       Follow-up Information     [name], MD Follow up in 1 week(s).   Specialty: Orthopedic Surgery Contact information: [address] [place] [address] [phone]        [name], PA-C. Schedule an appointment as soon as possible for a visit in 1 week(s).   Specialty: Physician Assistant Contact information: [address] [place] [address] [phone]              Allergies  Allergen Reactions  . Lisinopril Cough  . Venlafaxine Swelling    Pt reports swelling in his legs.   . Sildenafil Other (See Comments)    Headache  . Amlodipine Cough and Swelling    Pt reports swelling in his legs.     Consultations:  Orthopedic/ID   Procedures/Studies: DG Tibia/Fibula Left  Addendum Date: 07/09/2020   ADDENDUM REPORT: 07/09/2020 14:17 ADDENDUM: Original report read by Dr.  Bryn Gulling.  Addendum by Dr. Purcell Mouton. The radiographs and MRI were reviewed with Dr. Elinor Parkinson due to the discrepancy in the findings. The MR findings are typical of a stress fracture with marrow and surrounding soft tissue edema. There is no evidence focal soft tissue wound or adjacent fluid collection. In light of the MR findings, these preceding radiographic findings are felt to be due to a stress fracture rather than osteomyelitis. Recommend immobilization and radiographic follow-up to assess healing. Electronically Signed   By: Carey Bullocks M.D.   On: 07/09/2020 14:17   Result Date: 07/09/2020 CLINICAL DATA:  Pain Infection EXAM: LEFT TIBIA AND FIBULA - 2 VIEW COMPARISON:  None. FINDINGS: Mild diffuse soft tissue swelling of the left lower leg. There is irregularity of the medial cortex of the mid fibular diaphysis which could represent osteomyelitis. IMPRESSION: Mild diffuse soft tissue swelling of the left lower leg with cortical irregularity of the lateral cortex of the mid fibular diaphysis suspicious for osteomyelitis. Electronically Signed: By: Acquanetta Belling M.D. On: 07/08/2020 16:13   MR TIBIA FIBULA LEFT WO  CONTRAST  Result Date: 07/08/2020 CLINICAL DATA:  Soft tissue infection suspected EXAM: MRI OF LOWER LEFT EXTREMITY WITHOUT CONTRAST TECHNIQUE: Multiplanar, multisequence MR imaging of the left was performed. No intravenous contrast was administered. COMPARISON:  Radiograph same day FINDINGS: Bones/Joint/Cartilage There is a nondisplaced fracture seen through the mid fibula. Overlying periosteal reaction and diffuse soft tissue edema is seen. There is surrounding marrow edema seen throughout the midshaft of the fibula. The remainder of the osseous structures appear to be intact. No areas of cortical destruction or periosteal reaction. No large knee or ankle joint effusion is seen. Ligaments Suboptimally visualized Muscles and Tendons Mild edema surrounding the mid fibula. The muscles are otherwise intact is mild fatty atrophy. The visualized portion of the tendons are intact without tendinopathy or tear. Soft tissues Subcutaneous edema seen along the inferior medial aspect of the mid to distal lower extremity. IMPRESSION: Nondisplaced fracture through the mid fibula with surrounding periosteal reaction and significant marrow edema. There is also a subcutaneous and surrounding muscular edema. Electronically Signed   By: Jonna Clark M.D.   On: 07/08/2020 22:29       Subjective: Patient seen and examined at bedside.  Denies worsening lower  extremity pain.  No overnight fever or vomiting reported.  Feels okay to go home today.  Discharge Exam: Vitals:   07/10/20 0300 07/10/20 0810  BP: (!) 147/73 (!) 172/96  Pulse: 70 78  Resp: 16 18  Temp: 98 F (36.7 C) 98.3 F (36.8 C)  SpO2: 100% 94%    General: Pt is alert, awake, not in acute distress Cardiovascular: rate controlled, S1/S2 + Respiratory: bilateral decreased breath sounds at bases Abdominal: Soft, morbidly obese, NT, ND, bowel sounds + Extremities: Left lower extremity is in a cam walker; no cyanosis    The results of significant  diagnostics from this hospitalization (including imaging, microbiology, ancillary and laboratory) are listed below for reference.     Microbiology: Recent Results (from the past 240 hour(s))  Culture, blood (routine x 2)     Status: None (Preliminary result)   Collection Time: 07/09/20 12:10 AM   Specimen: BLOOD  Result Value Ref Range Status   Specimen Description BLOOD RIGHT ANTECUBITAL  Final   Special Requests   Final    BOTTLES DRAWN AEROBIC AND ANAEROBIC Blood Culture adequate volume   Culture   Final    NO GROWTH < 12 HOURS Performed at [facility], [address] [place], [place] [address]    Report Status PENDING  Incomplete  Culture, blood (routine x 2)     Status: None (Preliminary result)   Collection Time: 07/09/20 12:15 AM   Specimen: BLOOD LEFT HAND  Result Value Ref Range Status   Specimen Description BLOOD LEFT HAND  Final   Special Requests   Final    BOTTLES DRAWN AEROBIC AND ANAEROBIC Blood Culture adequate volume   Culture   Final    NO GROWTH < 12 HOURS Performed at [facility], [address] [place], [place] [address]    Report Status PENDING  Incomplete  SARS CORONAVIRUS 2 (TAT 6-24 HRS) Nasopharyngeal Nasopharyngeal Swab     Status: None   Collection Time: 07/09/20 12:41 AM   Specimen: Nasopharyngeal Swab  Result Value Ref Range Status   SARS Coronavirus 2 NEGATIVE NEGATIVE Final    Comment: (NOTE) SARS-CoV-2 target nucleic acids are NOT DETECTED.  The SARS-CoV-2 RNA is generally detectable in upper and lower respiratory specimens during the acute phase of infection. Negative results do not preclude SARS-CoV-2 infection, do not rule out co-infections with other pathogens, and should not be used as the sole basis for treatment or other patient management decisions. Negative results must be combined with clinical observations, patient history, and epidemiological information. The expected result is Negative.  Fact Sheet for  Patients: HairSlick.nohttps://www.fda.gov/media/138098/download  Fact Sheet for Healthcare Providers: quierodirigir.comhttps://www.fda.gov/media/138095/download  This test is not yet approved or cleared by the [place] FDA and  has been authorized for detection and/or diagnosis of SARS-CoV-2 by FDA under an Emergency Use Authorization (EUA). This EUA will remain  in effect (meaning this test can be used) for the duration of the COVID-19 declaration under Se ction 564(b)(1) of the Act, 21 U.S.C. section 360bbb-3(b)(1), unless the authorization is terminated or revoked sooner.  Performed at [facility], [address] [place], [place] [address]      Labs: BNP (last 3 results) No results for input(s): BNP in the last 8760 hours. Basic Metabolic Panel: Recent Labs  Lab 07/08/20 1516 07/10/20 0508  NA 134* 136  K 4.5 4.4  CL 97* 100  CO2 28 30  GLUCOSE 84 100*  BUN 14 13  CREATININE 1.33* 1.38*  CALCIUM 9.3 8.9  MG  --  1.9   Liver Function Tests: Recent Labs  Lab 07/08/20 1516 07/10/20 0508  AST 54* 52*  ALT 42 41  ALKPHOS 118 100  BILITOT 0.9 1.1  PROT 7.1 6.4*  ALBUMIN 3.5 3.2*   No results for input(s): LIPASE, AMYLASE in the last 168 hours. No results for input(s): AMMONIA in the last 168 hours. CBC: Recent Labs  Lab 07/08/20 1516 07/10/20 0508  WBC 5.9 4.3  NEUTROABS 4.0  --   HGB 13.7 12.5*  HCT 40.1 36.6*  MCV 92.8 93.6  PLT 216 178   Cardiac Enzymes: Recent Labs  Lab 07/09/20 1105  CKTOTAL 78   BNP: Invalid input(s): POCBNP CBG: No results for input(s): GLUCAP in the last 168 hours. D-Dimer No results for input(s): DDIMER in the last 72 hours. Hgb A1c No results for input(s): HGBA1C in the last 72 hours. Lipid Profile No results for input(s): CHOL, HDL, LDLCALC, TRIG, CHOLHDL, LDLDIRECT in the last 72 hours. Thyroid function studies No results for input(s): TSH, T4TOTAL, T3FREE, THYROIDAB in the last 72 hours.  Invalid input(s): FREET3 Anemia  work up No results for input(s): VITAMINB12, FOLATE, FERRITIN, TIBC, IRON, RETICCTPCT in the last 72 hours. Urinalysis No results found for: COLORURINE, APPEARANCEUR, LABSPEC, PHURINE, GLUCOSEU, HGBUR, BILIRUBINUR, KETONESUR, PROTEINUR, UROBILINOGEN, NITRITE, LEUKOCYTESUR Sepsis Labs Invalid input(s): PROCALCITONIN,  WBC,  LACTICIDVEN Microbiology Recent Results (from the past 240 hour(s))  Culture, blood (routine x 2)     Status: None (Preliminary result)   Collection Time: 07/09/20 12:10 AM   Specimen: BLOOD  Result Value Ref Range Status   Specimen Description BLOOD RIGHT ANTECUBITAL  Final   Special Requests   Final    BOTTLES DRAWN AEROBIC AND ANAEROBIC Blood Culture adequate volume   Culture   Final    NO GROWTH < 12 HOURS Performed at [facility], [address], [place], [place] [address]    Report Status PENDING  Incomplete  Culture, blood (routine x 2)     Status: None (Preliminary result)   Collection Time: 07/09/20 12:15 AM   Specimen: BLOOD LEFT HAND  Result Value Ref Range Status   Specimen Description BLOOD LEFT HAND  Final   Special Requests   Final    BOTTLES DRAWN AEROBIC AND ANAEROBIC Blood Culture adequate volume   Culture   Final    NO GROWTH < 12 HOURS Performed at [facility], [address] [place], [place] [address]    Report Status PENDING  Incomplete  SARS CORONAVIRUS 2 (TAT 6-24 HRS) Nasopharyngeal Nasopharyngeal Swab     Status: None   Collection Time: 07/09/20 12:41 AM   Specimen: Nasopharyngeal Swab  Result Value Ref Range Status   SARS Coronavirus 2 NEGATIVE NEGATIVE Final    Comment: (NOTE) SARS-CoV-2 target nucleic acids are NOT DETECTED.  The SARS-CoV-2 RNA is generally detectable in upper and lower respiratory specimens during the acute phase of infection. Negative results do not preclude SARS-CoV-2 infection, do not rule out co-infections with other pathogens, and should not be used as the sole basis for treatment or  other patient management decisions. Negative results must be combined with clinical observations, patient history, and epidemiological information. The expected result is Negative.  Fact Sheet for Patients: HairSlick.no  Fact Sheet for Healthcare Providers: quierodirigir.com  This test is not yet approved or cleared by the [place] FDA and  has been authorized for detection and/or diagnosis  of SARS-CoV-2 by FDA under an Emergency Use Authorization (EUA). This EUA will remain  in effect (meaning this test can be used) for the duration of the COVID-19 declaration under Se ction 564(b)(1) of the Act, 21 U.S.C. section 360bbb-3(b)(1), unless the authorization is terminated or revoked sooner.  Performed at [facility], [address], [place], [place] [address]      Time coordinating discharge: 35 minutes  SIGNED:   Glade Lloyd, MD  [facility] 07/10/2020, 9:37 AM

## 2020-07-10 NOTE — Progress Notes (Incomplete)
Occupational Therapy Evaluation Patient Details Name: Justin Greer MRN: [id number] DOB: [birth year] Today's Date: 07/10/2020    History of Present Illness 50 yo male admitted to [facility] on 4/5 with acute LLE pain x3 weeks. Left tibia and fibula x-ray showed mild diffuse soft tissue swelling of the left lower leg with cortical irregularity of the lateral cortex of the mid fibular diaphysis suspicious for osteomyelitis. MRI shows nondisplaced fx through midfibula (per ortho, stress fracture) with surround periosteal reaction and marrow edema. PMH includes adenoidectomy, HTN, GERD, R bunionectomy, L shoulder surgery.   Clinical Impression   Virl Diamond reports that he was independent prior to admission, he lives at home with his wife and son and step children. Chuck completed all functional ADLs independently. He was educated on his cam boot, and was receptive to his need to shower while sitting at home. He currently has a shower bench he is able to use. No follow up OT recommended.     Follow Up Recommendations  No OT follow up    Equipment Recommendations  None recommended by OT       Precautions / Restrictions Precautions Precautions: None Precaution Comments: compression sock on LLE Required Braces or Orthoses: Other Brace Other Brace: CAM boot, WBAT Restrictions Other Position/Activity Restrictions: LLE WBAT      Mobility Bed Mobility Overal bed mobility: Independent Bed Mobility: Supine to Sit     Supine to sit: Independent     General bed mobility comments: no physical assist    Transfers Overall transfer level: Independent Equipment used: None                  Balance Overall balance assessment: Independent               ADL either performed or assessed with clinical judgement   ADL Overall ADL's : Independent         General ADL Comments: Pt independently comlpeted all ADLs today including management of cam boot. Pt informed that he must shower while  sitting with cam boot off                  Pertinent Vitals/Pain Pain Location: L lower leg Pain Descriptors / Indicators: Discomfort     Hand Dominance Right   Extremity/Trunk Assessment     Lower Extremity Assessment LLE Deficits / Details: Compression and cam boot on LLE       Communication Communication Communication: No difficulties   Cognition Arousal/Alertness: Awake/alert Behavior During Therapy: WFL for tasks assessed/performed Overall Cognitive Status: Within Functional Limits for tasks assessed                General Comments  Pt donned stocking and cam boot, no visable skin integrity issues            Home Living Family/patient expects to be discharged to:: Private residence Living Arrangements: Spouse/significant other Available Help at Discharge: Family Type of Home: House Home Access: Stairs to enter Secretary/administrator of Steps: 3 Entrance Stairs-Rails: None Home Layout: One level     Bathroom Shower/Tub: Tub/shower unit;Walk-in shower   Bathroom Toilet: Standard Bathroom Accessibility:  (No grab bars in bathroom)   Home Equipment: Crutches   Additional Comments: Pt reported that his son's shower within the home is walkin and has a shower bench that he will use, he has used it previously post a R foot surgery.      Prior Functioning/Environment Level of Independence: Independent  Comments: Pt does not work, he is on disability        OT Problem List: Pain;Decreased activity tolerance;Impaired balance (sitting and/or standing);Decreased knowledge of use of DME or AE      OT Treatment/Interventions:      OT Goals(Current goals can be found in the care plan section) Acute Rehab OT Goals Patient Stated Goal: home  OT Frequency:      AM-PAC OT "6 Clicks" Daily Activity     Outcome Measure Help from another person eating meals?: None Help from another person taking care of personal grooming?: None Help from  another person toileting, which includes using toliet, bedpan, or urinal?: None Help from another person bathing (including washing, rinsing, drying)?: None Help from another person to put on and taking off regular upper body clothing?: None Help from another person to put on and taking off regular lower body clothing?: None 6 Click Score: 24   End of Session Equipment Utilized During Treatment:  (None) Nurse Communication:  (Discharge is appropriate from OT standpoint)  Activity Tolerance: Patient tolerated treatment well Patient left: in chair;with call bell/phone within reach  OT Visit Diagnosis: Unsteadiness on feet (R26.81);Pain Pain - Right/Left: Left Pain - part of body: Leg                Time: 2947-6546 OT Time Calculation (min): 9 min Charges:  OT General Charges $OT Visit: 1 Visit OT Evaluation $OT Eval Low Complexity: 1 Low    Samantha A Droese 07/10/2020, 3:31 PM

## 2020-07-11 ENCOUNTER — Other Ambulatory Visit (HOSPITAL_COMMUNITY): Payer: Self-pay

## 2020-07-14 LAB — CULTURE, BLOOD (ROUTINE X 2)
Culture: NO GROWTH
Culture: NO GROWTH
Special Requests: ADEQUATE
Special Requests: ADEQUATE

## 2020-07-16 ENCOUNTER — Inpatient Hospital Stay: Payer: No Typology Code available for payment source | Admitting: Infectious Diseases

## 2020-07-30 ENCOUNTER — Encounter: Payer: Self-pay | Admitting: Orthopaedic Surgery

## 2020-07-30 ENCOUNTER — Ambulatory Visit (INDEPENDENT_AMBULATORY_CARE_PROVIDER_SITE_OTHER): Payer: No Typology Code available for payment source | Admitting: Orthopaedic Surgery

## 2020-07-30 DIAGNOSIS — T24232D Burn of second degree of left lower leg, subsequent encounter: Secondary | ICD-10-CM

## 2020-07-30 DIAGNOSIS — S81802A Unspecified open wound, left lower leg, initial encounter: Secondary | ICD-10-CM | POA: Diagnosis not present

## 2020-07-30 DIAGNOSIS — S82425A Nondisplaced transverse fracture of shaft of left fibula, initial encounter for closed fracture: Secondary | ICD-10-CM | POA: Diagnosis not present

## 2020-07-30 NOTE — Progress Notes (Signed)
Office Visit Note   Patient: Justin Greer           Date of Birth: February 18, 1971           MRN: [id number] Visit Date: 07/30/2020              Requested by: [name], PA-C [address] [place],  [place] [address] PCP: [name], PA-C   Assessment & Plan: Visit Diagnoses:  1. Closed nondisplaced transverse fracture of shaft of left fibula, initial encounter   2. Wound of left lower extremity, initial encounter   3. Partial thickness burn of left lower leg, subsequent encounter     Plan: He will continue to use Silvadene ointment to the wounds with dry dressing.  He has a follow-up with the wound center.  In terms of the fibula fracture this is pretty much healed and will not need any continued orthopedic care.  He may stop wearing the cam boot at this point.  We will see him back as needed.  Follow-Up Instructions: No follow-ups on file.   Orders:  No orders of the defined types were placed in this encounter.  No orders of the defined types were placed in this encounter.     Procedures: No procedures performed   Clinical Data: No additional findings.   Subjective: Chief Complaint  Patient presents with  . Left Leg - Pain    Justin Greer is a 50 year old gentleman who is a follow-up from the ED and hospital for a subacute left midshaft fibula fracture and wounds of the lower leg.  He developed these blisters because he left a heating pad on his leg for too long.  He has been applying Silvadene to these wounds since the hospital.  An MRI which was ordered to rule out deep infection incidentally found a healing midshaft fibula fracture.  He does have known vitamin D deficiency.   Review of Systems  Constitutional: Negative.   All other systems reviewed and are negative.    Objective: Vital Signs: There were no vitals taken for this visit.  Physical Exam Vitals and nursing note reviewed.  Constitutional:      Appearance: He is well-developed.  Pulmonary:      Effort: Pulmonary effort is normal.  Abdominal:     Palpations: Abdomen is soft.  Skin:    General: Skin is warm.  Neurological:     Mental Status: He is alert and oriented to person, place, and time.  Psychiatric:        Behavior: Behavior normal.        Thought Content: Thought content normal.        Judgment: Judgment normal.     Ortho Exam Left lower extremity shows multiple wounds of the lower leg with good granulation tissue.  No signs of infection or compromise.  Fracture site is nontender. Specialty Comments:  No specialty comments available.  Imaging: No results found.   PMFS History: Patient Active Problem List   Diagnosis Date Noted  . Left fibular fracture 07/09/2020  . Cellulitis of left lower extremity 07/09/2020  . Pyogenic inflammation of bone (HCC) 07/09/2020  . Leg wound, left 07/09/2020  . Obesity, Class III, BMI 40-49.9 (morbid obesity) (HCC) 07/09/2020  . Essential hypertension 07/09/2020  . GERD (gastroesophageal reflux disease) 07/09/2020  . Second degree burn of left lower leg    Past Medical History:  Diagnosis Date  . GERD (gastroesophageal reflux disease)   . High blood pressure  Family History  Problem Relation Age of Onset  . High blood pressure Mother   . Heart disease Mother   . Diabetes Mother   . Diabetes Father   . Heart disease Father   . High blood pressure Father   . Stroke Father   . High blood pressure Brother     Past Surgical History:  Procedure Laterality Date  . ADENOIDECTOMY    . BACK SURGERY  2016  . BUNIONECTOMY Right 2017  . FOOT SURGERY Right 2005  . lymphnode removal  1981  . SHOULDER SURGERY Left 2013  . TONSILLECTOMY     Social History   Occupational History  . Not on file  Tobacco Use  . Smoking status: Never Smoker  . Smokeless tobacco: Never Used  Vaping Use  . Vaping Use: Never used  Substance and Sexual Activity  . Alcohol use: Yes    Alcohol/week: 12.0 standard drinks    Types: 12 Cans  of beer per week  . Drug use: No  . Sexual activity: Not on file

## 2021-08-05 ENCOUNTER — Emergency Department (HOSPITAL_COMMUNITY): Payer: No Typology Code available for payment source

## 2021-08-05 ENCOUNTER — Emergency Department (HOSPITAL_COMMUNITY)
Admission: EM | Admit: 2021-08-05 | Discharge: 2021-08-05 | Disposition: A | Discharge status: Home / Self Care | Payer: No Typology Code available for payment source | Attending: Emergency Medicine | Admitting: Emergency Medicine

## 2021-08-05 DIAGNOSIS — S61211A Laceration without foreign body of left index finger without damage to nail, initial encounter: Secondary | ICD-10-CM

## 2021-08-05 DIAGNOSIS — S6992XA Unspecified injury of left wrist, hand and finger(s), initial encounter: Secondary | ICD-10-CM | POA: Diagnosis present

## 2021-08-05 DIAGNOSIS — W260XXA Contact with knife, initial encounter: Secondary | ICD-10-CM | POA: Diagnosis not present

## 2021-08-05 DIAGNOSIS — S66321A Laceration of extensor muscle, fascia and tendon of left index finger at wrist and hand level, initial encounter: Secondary | ICD-10-CM | POA: Diagnosis not present

## 2021-08-05 NOTE — ED Provider Notes (Signed)
?[facility] EMERGENCY DEPARTMENT ?Provider Note ? ? ?CSN: [id number] ?Arrival date & time: 08/05/21  1040 ? ?  ? ?History ? ?Chief Complaint  ?Patient presents with  ? Finger Injury  ? ? ?Justin Greer is a 51 y.o. male. ? ?The history is provided by the patient and medical records. No language interpreter was used.  ? ?51 year old male sent here from urgent care center for evaluation of finger injury.  Patient report he injured his left index fingers 2 weeks ago while he was cutting.  He suffered a laceration to the dorsum of the second PIP.  He had laceration repair urgent care center and x-ray at that time was normal.  He actually jammed the same finger against his body a few days later and since then he is unable to fully extend his finger.  He does have an appointment to follow-up with [facility] but despite calling multiple times he have not had any opportunity to be evaluated by a specialist.  He has been taking Tylenol at home without adequate relief.  He denies any numbness but occasional V endorsing tingling.  He is right-hand dominant.  No other complaint ? ? ? ? ?Home Medications ?Prior to Admission medications   ?Medication Sig Start Date End Date Taking? Authorizing Provider  ?diazepam (VALIUM) 10 MG tablet Take 10 mg by mouth 3 (three) times daily.    [provider]  ?DULoxetine (CYMBALTA) 30 MG capsule Take 30 mg by mouth at bedtime.    [provider]  ?DULoxetine (CYMBALTA) 60 MG capsule Take 60 mg by mouth daily.    [provider]  ?Ergocalciferol (VITAMIN D2) 50 MCG (2000 UT) TABS Take 2,000 Units by mouth daily.    [provider]  ?hydrALAZINE (APRESOLINE) 25 MG tablet Take 25 mg by mouth in the morning and at bedtime.    [provider]  ?losartan (COZAAR) 100 MG tablet Take 10 mg by mouth daily.    [provider]  ?methocarbamol (ROBAXIN) 500 MG tablet Take 250 mg by mouth 3 (three) times daily as needed for muscle  spasms.    [provider]  ?montelukast (SINGULAIR) 10 MG tablet Take 1 tablet (10 mg total) by mouth at bedtime. 03/04/16   Kozlow, Alvira Philips, MD  ?oxyCODONE (ROXICODONE) 5 MG immediate release tablet Take 1 tablet (5 mg total) by mouth every 8 (eight) hours as needed for severe pain. 07/10/20   Glade Lloyd, MD  ?pantoprazole (PROTONIX) 40 MG tablet Take 40 mg by mouth daily.    [provider]  ?propranolol (INDERAL) 40 MG tablet Take 40 mg by mouth 2 (two) times daily.    [provider]  ?   ? ?Allergies    ?Lisinopril, Venlafaxine, Sildenafil, and Amlodipine   ? ?Review of Systems   ?Review of Systems  ?All other systems reviewed and are negative. ? ?Physical Exam ?Updated Vital Signs ?BP (!) 158/82   Pulse 78   Temp 97.9 ?F (36.6 ?C) (Oral)   Resp 18   Ht 5\' 10"  (1.778 m)   Wt (!) 137.9 kg   SpO2 100%   BMI 43.62 kg/m?  ?Physical Exam ?Vitals and nursing note reviewed.  ?Constitutional:   ?   General: He is not in acute distress. ?   Appearance: He is well-developed.  ?HENT:  ?   Head: Atraumatic.  ?Eyes:  ?   Conjunctiva/sclera: Conjunctivae normal.  ?Musculoskeletal:     ?  General: Deformity (Left index finger: Well-healing laceration noted to the dorsum of the second PIP.  Finger is in a flexed position able to passively extend finger but unable to actively extend his finger.  Brisk cap refill.  Sensation intact distally) present.  ?   Cervical back: Neck supple.  ?Skin: ?   Findings: No rash.  ?Neurological:  ?   Mental Status: He is alert.  ? ? ?ED Results / Procedures / Treatments   ?Labs ?(all labs ordered are listed, but only abnormal results are displayed) ?Labs Reviewed - No data to display ? ?EKG ?None ? ?Radiology ?DG Hand Complete Left ? ?Result Date: 08/05/2021 ?CLINICAL DATA:  Sharpening a knife and cut his left middle finger (around PIP), index finger (around PIP) and thumb (around IP) x April 10, then jammed left index finger 1 week later EXAM: LEFT HAND -  COMPLETE 3+ VIEW COMPARISON:  None Available. FINDINGS: There is no evidence of acute fracture or dislocation. There is no evidence of significant arthropathy. Chronic appearing cortical irregularity of the third digit involving the middle and distal phalanges. No radiopaque foreign body. IMPRESSION: No acute fracture or dislocation of the left hand. No radiopaque foreign body. Electronically Signed   By: Maudry Mayhew M.D.   On: 08/05/2021 11:39   ? ?Procedures ?Procedures  ? ? ?Medications Ordered in ED ?Medications - No data to display ? ?ED Course/ Medical Decision Making/ A&P ?  ?                        ?Medical Decision Making ? ?BP (!) 158/82   Pulse 78   Temp 97.9 ?F (36.6 ?C) (Oral)   Resp 18   Ht 5\' 10"  (1.778 m)   Wt (!) 137.9 kg   SpO2 100%   BMI 43.62 kg/m?  ? ?3:57 PM ?Patient here with complaints of deformity to his left index finger when he accidentally cut the dorsum of his left index finger around the PIP with a knife a few weeks ago.  He did have laceration repair.  At this time he is unable to fully extend his finger.  X-ray obtained today without any acute fracture or dislocation.  No foreign body noted.  X-ray was independently visualized and entered by me and agrees with radiologist interpretation.  Given presentation, I suspect patient has suffered an extensor tendon laceration.  Will place finger in a finger splint, patient will likely need to wear finger splint for the next 6 to 8 weeks.  I will give patient referral to hand specialist for outpatient management as he may benefit from laceration repair if indicated.  No signs of infection were noted.  No nerve injury appreciated.  Patient voiced understanding and agrees with plan. ? ? ? ? ? ? ? ?Final Clinical Impression(s) / ED Diagnoses ?Final diagnoses:  ?Laceration of left index finger with tendon involvement, initial encounter  ? ? ?Rx / DC Orders ?ED Discharge Orders   ? ? None  ? ?  ? ? ?  ? , PA-C ?08/05/21 1606 ? ?   ?10/05/21, DO ?08/05/21 1641 ? ?

## 2021-08-05 NOTE — ED Triage Notes (Signed)
Pt. Stated, I injured my left index finger on April 17 after I cut it I jammed it picking up a water bottle, its been crooked ever since. Ive tried to get in at the [place] but no one will call me back.  ?Pt's finger crooked ?

## 2021-08-05 NOTE — Discharge Instructions (Signed)
You have been evaluated for your symptoms.  You have likely suffered a laceration to the extensor tendons on your left index finger.  Please wear finger splint continuously for the next 6 to 8 weeks to allow for it to heal.  You may call and follow-up closely with a hand specialist next week for further management.  Continue taking Tylenol or ibuprofen as needed for pain. ?

## 2021-08-05 NOTE — ED Provider Triage Note (Signed)
Emergency Medicine Provider Triage Evaluation Note ? ?Justin Greer , a 51 y.o. male  was evaluated in triage.  Pt complains of left index finger swelling.  Reports that he had stitches placed 2 weeks ago after slicing his index and middle finger with a knife.  He went to get the stitches out on 4/24 and had an x-ray done that showed a potential injury to his tendons. ? ?Review of Systems  ?Positive: Pain and decreased range of motion of left index finger ?Negative: Numbness or tingling ? ?Physical Exam  ?BP (!) 207/100 (BP Location: Right Arm)   Pulse 71   Temp 98.5 ?F (36.9 ?C) (Oral)   Resp 20   Ht 5' 10" (1.778 m)   Wt (!) 137.9 kg   SpO2 97%   BMI 43.62 kg/m?  ?Gen:   Awake, no distress   ?Resp:  Normal effort  ?MSK:   Moves extremities without difficulty  ?Other:  Index finger stuck in flexion at the PIP.  PIP extension obtainable with assistance.  Any further flexion is met with severe pain. ? ?Medical Decision Making  ?Medically screening exam initiated at 11:11 AM.  Appropriate orders placed.  Justin Greer was informed that the remainder of the evaluation will be completed by another provider, this initial triage assessment does not replace that evaluation, and the importance of remaining in the ED until their evaluation is complete. ? ?Urgent care x-ray says "there is moderate flexion deformity of the proximal interphalangeal joint of the second digit.  There is a volar plate fracture in the middle phalanx at the level of the PIP joint of the fifth digit of indeterminate age ? ?  ?Rhae Hammock, PA-C ?08/05/21 1118 ? ?

## 2022-12-23 IMAGING — DX DG HAND COMPLETE 3+V*L*
3 series · 3 of 3 positions shown · non-contrast
Comparison: None Available.

CLINICAL DATA: Sharpening a knife and cut his left middle finger
(around PIP), index finger (around PIP) and thumb (around IP) x
[DATE], then jammed left index finger 1 week later

EXAM:
LEFT HAND - COMPLETE 3+ VIEW

[hand pa]
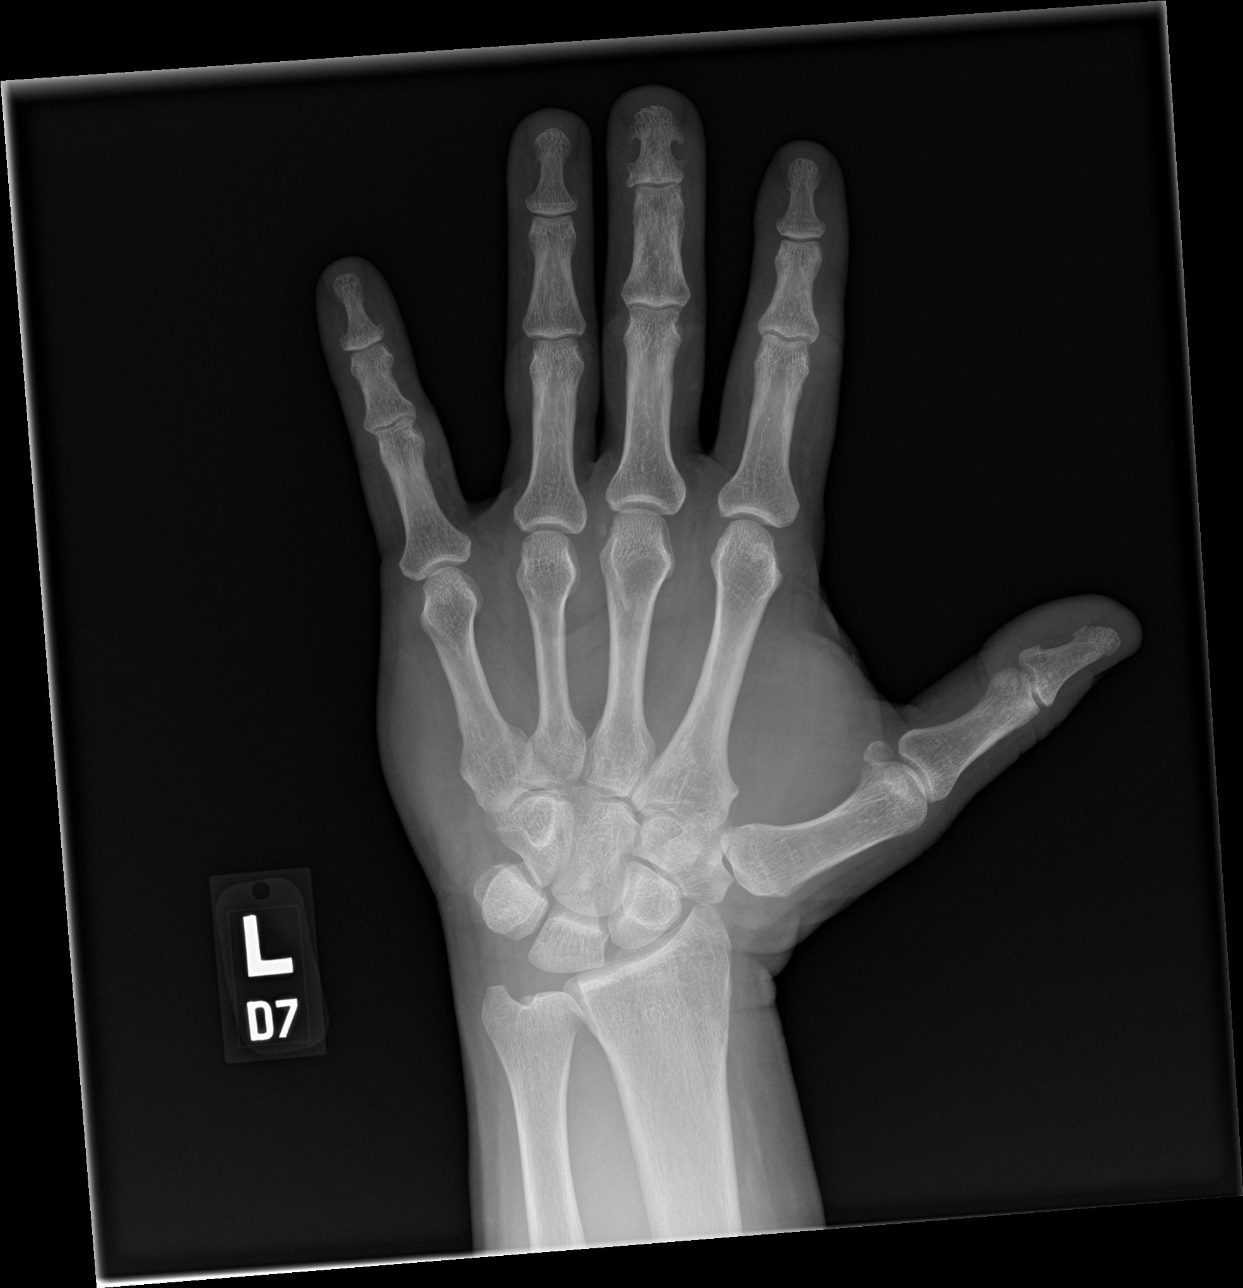

[hand obl]
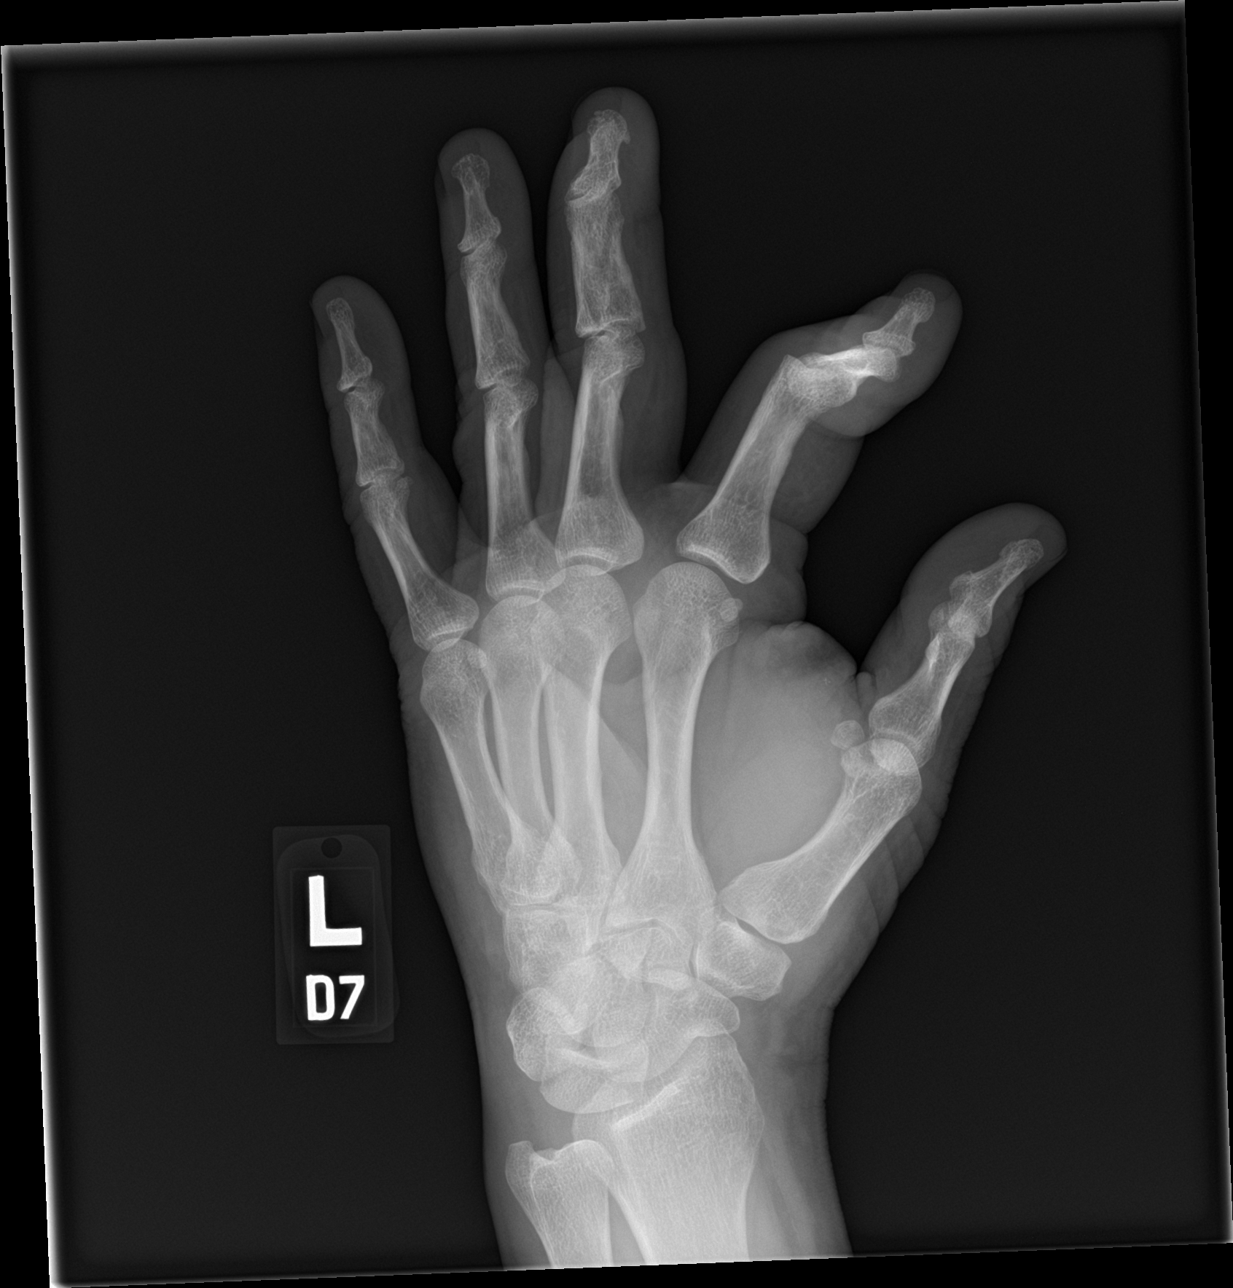

[hand lat]
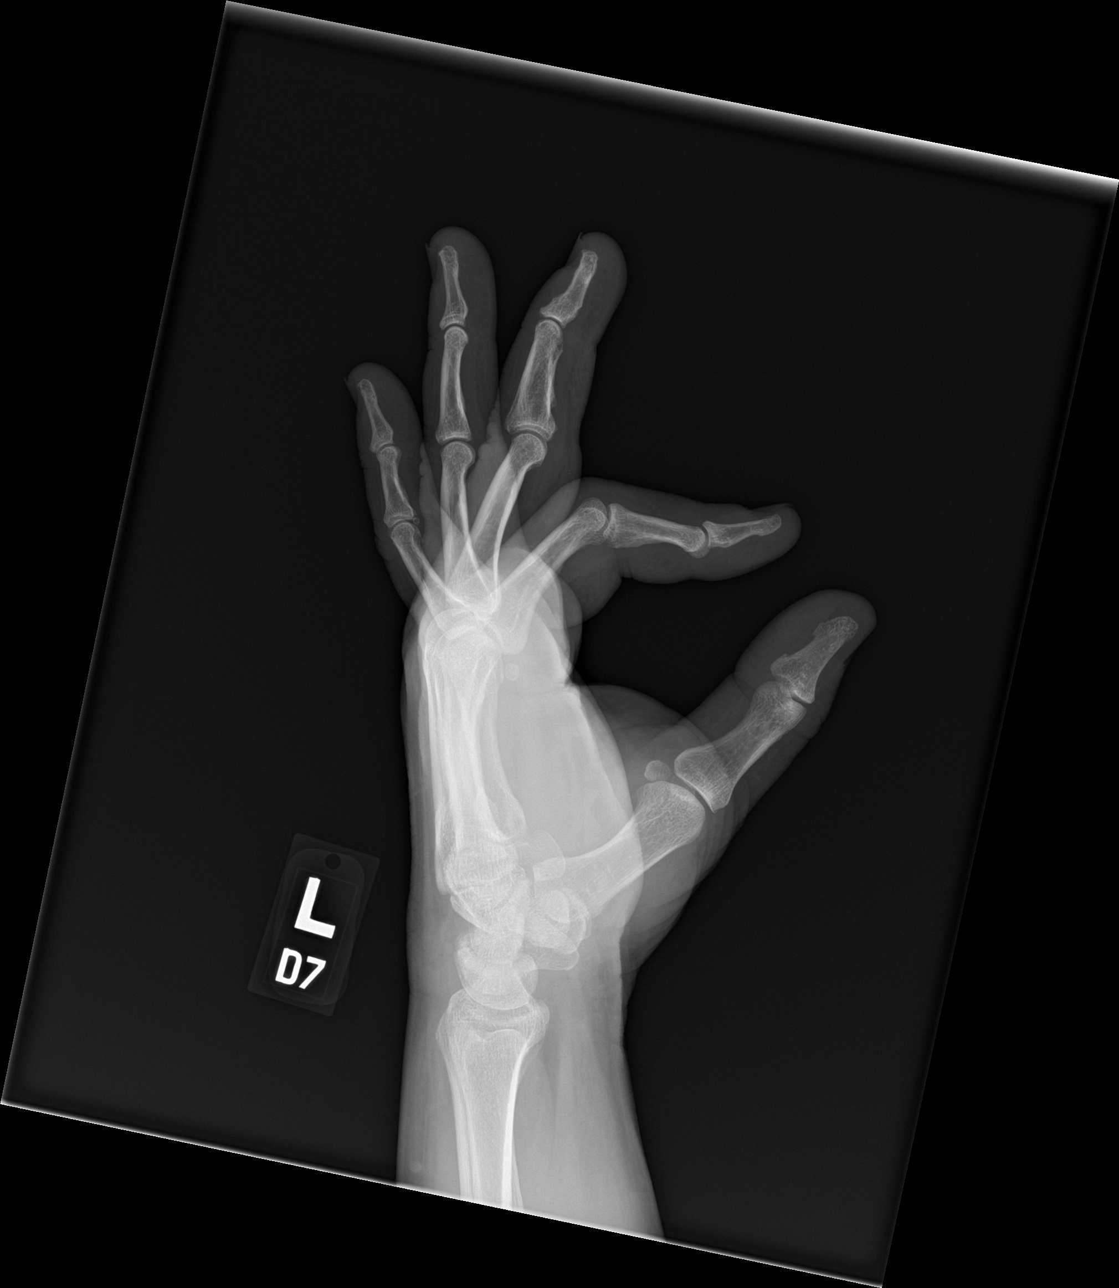

[3 of 3 positions shown; findings below may reference images not displayed]

FINDINGS: There is no evidence of acute fracture or dislocation. There is no
evidence of significant arthropathy. Chronic appearing cortical
irregularity of the third digit involving the middle and distal
phalanges. No radiopaque foreign body.
IMPRESSION: No acute fracture or dislocation of the left hand. No radiopaque
foreign body.

## 2023-04-23 ENCOUNTER — Encounter: Payer: Self-pay | Admitting: Internal Medicine

## 2023-04-23 ENCOUNTER — Ambulatory Visit (INDEPENDENT_AMBULATORY_CARE_PROVIDER_SITE_OTHER): Payer: Non-veteran care | Admitting: Internal Medicine

## 2023-04-23 VITALS — BP 158/76 | HR 96 | Resp 18 | Ht 69.0 in | Wt 297.0 lb

## 2023-04-23 DIAGNOSIS — I10 Essential (primary) hypertension: Secondary | ICD-10-CM | POA: Diagnosis not present

## 2023-04-23 DIAGNOSIS — K219 Gastro-esophageal reflux disease without esophagitis: Secondary | ICD-10-CM

## 2023-04-23 DIAGNOSIS — J3089 Other allergic rhinitis: Secondary | ICD-10-CM

## 2023-04-23 MED ORDER — PATADAY 0.7 % OP SOLN: 1.0000 [drp] | Freq: Every day | OPHTHALMIC | 5 refills | Status: AC | PRN

## 2023-04-23 NOTE — Patient Instructions (Addendum)
Chronic Rhinitis Seasonal and Perennial Allergic: - Recurrence of symptoms likely due to not completing full course of immunotherapy, worsening allergy season and initiation of CPAP therapy   Will recheck sensitizations with lab work today   - Prevention:  - allergen avoidance when possible - consider allergy shots as long term control of your symptoms by teaching your immune system to be more tolerant of your allergy triggers  - Symptom control:  Sample of rylatris given: 1 spray per nostril twice daily   If this cannot be covered by insurance, we can send this in for cash pay $30-42 of start therapy plan below:   - Start Nasal Steroid Spray: Best results if used daily. - Options include Flonase (fluticasone), Nasocort (triamcinolone), Nasonex (mometasome) 1- 2 sprays in each nostril daily.  - All can be purchased over-the-counter if not covered by insurance. - Start Astelin (azelastine) 1-2 sprays in each nostril twice a day as needed for nasal congestion/itchy nose - Continue Singulair (Montelukast) 10mg  nightly.   - Discontinue if nightmares of behavior changes. - Continue Antihistamine: daily or daily as needed.   -Options include Zyrtec (Cetirizine) 10mg , Claritin (Loratadine) 10mg , Allegra (Fexofenadine) 180mg , or Xyzal (Levocetirinze) 5mg  - Can be purchased over-the-counter if not covered by insurance.  Allergic Conjunctivitis:  - Start Allergy Eye drops-great options include Pataday (Olopatadine) or Zaditor (ketotifen) for eye symptoms daily as needed-both sold over the counter if not covered by insurance. and Rewetting Drops such as Systane,TheraTears, etc  -Avoid eye drops that say red eye relief as they may contain medications that dry out your eyes.   Start allergy injections via [facility]  Had a detailed discussion with patient/family that clinical history is suggestive of allergic rhinitis, and may benefit from allergy immunotherapy (AIT). Discussed in detail regarding the  dosing, schedule, side effects (mild to moderate local allergic reaction and rarely systemic allergic reactions including anaphylaxis/death), alternatives and benefits (significant improvement in nasal symptoms, seasonal flares of asthma) of immunotherapy with the patient. There is significant time commitment involved with allergy shots, which includes weekly immunotherapy injections for first 9-12 months and then biweekly to monthly injections for 3-5 years. Clinical response is often delayed and patient may not see an improvement for 6-12 months. Consent was signed. I have prescribed epinephrine injectable and demonstrated proper use. For mild symptoms you can take over the counter antihistamines such as Benadryl and monitor symptoms closely. If symptoms worsen or if you have severe symptoms including breathing issues, throat closure, significant swelling, whole body hives, severe diarrhea and vomiting, lightheadedness then inject epinephrine and seek immediate medical care afterwards. Action plan given.  Follow up: for RUSH, we will reach out to you to schedule   Thank you so much for letting me partake in your care today.  Don't hesitate to reach out if you have any additional concerns!  Ferol Luz, MD  [facility] [place], [place]

## 2023-04-23 NOTE — Progress Notes (Unsigned)
NEW PATIENT Date of Service/Encounter:  04/28/23 Referring provider: Georgiann Mohs, MD Primary care provider: Georgiann Mohs, MD  Subjective:  Justin Greer is a 53 y.o. male  presenting today for evaluation of allergic rhinitis  History obtained from: chart review and patient and wife .   Discussed the use of AI scribe software for clinical note transcription with the patient, who gave verbal consent to proceed.  History of Present Illness   The patient, previously treated with immunotherapy vial 1 (G-C-D), Vial 2 (DM-W) for allergic symptoms, presents with a recurrence of symptoms since May of the previous year. He reports experiencing congestion, headaches, sneezing, and itchy eyes. The patient also notes occasional skin symptoms during severe episodes of congestion and sneezing. He has been managing symptoms with Singulair and over-the-counter antihistamines, taken daily. Last injection 0.10ml of green on 09/11/16.    The patient's symptoms have been worsening progressively, despite efforts to reduce allergen exposure in his home environment, including encapsulation and replacing carpet with laminate flooring. The patient also uses a CPAP machine, which he has been using for the past three years. He reports that congestion sometimes interferes with the use of the CPAP machine.  The patient has a history of a deviated septum, which he believes contributes to his nasal congestion. He has not been using any nasal sprays, reporting that they tend to run out of the nose immediately after application.  The patient completed a six-month course of immunotherapy injections in 2018, which he reports led to significant improvement in his symptoms. However, he cannot recall why the treatment was discontinued. Since discontinuation, the patient's symptoms have gradually returned and worsened.          Other allergy screening: Asthma: no Rhino conjunctivitis: yes Food allergy:  no Medication allergy: no Hymenoptera allergy: no Urticaria: no Eczema:no History of recurrent infections suggestive of immunodeficency: no Vaccinations are up to date.   Past Medical History: Past Medical History:  Diagnosis Date   GERD (gastroesophageal reflux disease)    High blood pressure    Medication List:  Current Outpatient Medications  Medication Sig Dispense Refill   Cholecalciferol (VITAMIN D3) 50 MCG (2000 UT) TABS Take 200 Units by mouth daily.     diazepam (VALIUM) 10 MG tablet Take 10 mg by mouth 3 (three) times daily as needed for anxiety (or spasms).     DULoxetine (CYMBALTA) 30 MG capsule Take 30 mg by mouth at bedtime.     DULoxetine (CYMBALTA) 60 MG capsule Take 60 mg by mouth in the morning.     ferrous sulfate 325 (65 FE) MG tablet Take 1 tablet by mouth every other day.     hydrALAZINE (APRESOLINE) 25 MG tablet Take 25 mg by mouth in the morning and at bedtime.     losartan (COZAAR) 100 MG tablet Take 100 mg by mouth daily.     montelukast (SINGULAIR) 10 MG tablet Take 1 tablet (10 mg total) by mouth at bedtime. 30 tablet 0   Olopatadine HCl (PATADAY) 0.7 % SOLN Apply 1 drop to eye daily as needed (itchy watery eyes). 2.5 mL 5   pantoprazole (PROTONIX) 40 MG tablet Take 40 mg by mouth daily before breakfast.     propranolol (INDERAL) 40 MG tablet Take 40 mg by mouth in the morning and at bedtime.     tadalafil (CIALIS) 10 MG tablet Take 10 mg by mouth daily as needed for erectile dysfunction.     No current  facility-administered medications for this visit.   Known Allergies:  Allergies  Allergen Reactions   Lisinopril Cough   Venlafaxine Swelling and Other (See Comments)    Pt reports swelling in his legs   Effexor Xr [Venlafaxine Hcl Er] Other (See Comments) and Hypertension    Elevated blood pressure, Weight gain   Sildenafil Other (See Comments)    Headaches   Amlodipine Swelling, Other (See Comments) and Cough    Pt reports swelling in his  legs/edema.   Past Surgical History: Past Surgical History:  Procedure Laterality Date   ADENOIDECTOMY     BACK SURGERY  2016   BUNIONECTOMY Right 2017   FOOT SURGERY Right 2005   lymphnode removal  1981   SHOULDER SURGERY Left 2013   TONSILLECTOMY     Family History: Family History  Problem Relation Age of Onset   High blood pressure Mother    Heart disease Mother    Diabetes Mother    Diabetes Father    Heart disease Father    High blood pressure Father    Stroke Father    High blood pressure Brother    Social History: Jovonni lives [facility] that is 52 years old, no mildew in the house, laminate in family room, carpet in bedroom. Heat pump heating, central cooling, 2 dogs without access to bedroom, no dust mite precautions, is a disabled Web designer. .   ROS:  All other systems negative except as noted per HPI.  Objective:  Blood pressure (!) 158/76, pulse 96, resp. rate 18, height 5\' 9"  (1.753 m), weight 297 lb (134.7 kg), SpO2 97%. Body mass index is 43.86 kg/m. Physical Exam:  General Appearance:  Alert, cooperative, no distress, appears stated age  Head:  Normocephalic, without obvious abnormality, atraumatic  Eyes:  Conjunctiva clear, EOM's intact  Ears EACs normal bilaterally  Nose: Nares normal,  edematous and erythematous nasal mucosa with clear rhinnorhea , hypertrophic turbinates, no visible anterior polyps, and septum midline  Throat: Lips, tongue normal; teeth and gums normal, surgically absent tonsils  Neck: Supple, symmetrical  Lungs:   clear to auscultation bilaterally, Respirations unlabored, no coughing  Heart:  regular rate and rhythm and no murmur, Appears well perfused  Extremities: No edema  Skin: Skin color, texture, turgor normal and no rashes or lesions on visualized portions of skin  Neurologic: No gross deficits   Diagnostics:   Labs:  Lab Orders         Allergens, Zone 2       Assessment and Plan  Assessment and Plan   Chronic  Rhinitis Seasonal and Perennial Allergic: - Recurrence of symptoms likely due to not completing full course of immunotherapy, worsening allergy season and initiation of CPAP therapy   Will recheck sensitizations with lab work today   - Prevention:  - allergen avoidance when possible - consider allergy shots as long term control of your symptoms by teaching your immune system to be more tolerant of your allergy triggers  - Symptom control:  Sample of rylatris given: 1 spray per nostril twice daily   If this cannot be covered by insurance, we can send this in for cash pay $30-42 of start therapy plan below:   - Start Nasal Steroid Spray: Best results if used daily. - Options include Flonase (fluticasone), Nasocort (triamcinolone), Nasonex (mometasome) 1- 2 sprays in each nostril daily.  - All can be purchased over-the-counter if not covered by insurance. - Start Astelin (azelastine) 1-2 sprays in each nostril twice  a day as needed for nasal congestion/itchy nose - Continue Singulair (Montelukast) 10mg  nightly.   - Discontinue if nightmares of behavior changes. - Continue Antihistamine: daily or daily as needed.   -Options include Zyrtec (Cetirizine) 10mg , Claritin (Loratadine) 10mg , Allegra (Fexofenadine) 180mg , or Xyzal (Levocetirinze) 5mg  - Can be purchased over-the-counter if not covered by insurance.  Allergic Conjunctivitis:  - Start Allergy Eye drops-great options include Pataday (Olopatadine) or Zaditor (ketotifen) for eye symptoms daily as needed-both sold over the counter if not covered by insurance. and Rewetting Drops such as Systane,TheraTears, etc  -Avoid eye drops that say red eye relief as they may contain medications that dry out your eyes.   Start allergy injections via [facility]  Had a detailed discussion with patient/family that clinical history is suggestive of allergic rhinitis, and may benefit from allergy immunotherapy (AIT). Discussed in detail regarding the dosing,  schedule, side effects (mild to moderate local allergic reaction and rarely systemic allergic reactions including anaphylaxis/death), alternatives and benefits (significant improvement in nasal symptoms, seasonal flares of asthma) of immunotherapy with the patient. There is significant time commitment involved with allergy shots, which includes weekly immunotherapy injections for first 9-12 months and then biweekly to monthly injections for 3-5 years. Clinical response is often delayed and patient may not see an improvement for 6-12 months. Consent was signed. I have prescribed epinephrine injectable and demonstrated proper use. For mild symptoms you can take over the counter antihistamines such as Benadryl and monitor symptoms closely. If symptoms worsen or if you have severe symptoms including breathing issues, throat closure, significant swelling, whole body hives, severe diarrhea and vomiting, lightheadedness then inject epinephrine and seek immediate medical care afterwards. Action plan given.  Follow up: for [facility], we will reach out to you to schedule     This note in its entirety was forwarded to the Provider who requested this consultation.  Other:  none   Thank you for your kind referral. I appreciate the opportunity to take part in Zarif's care. Please do not hesitate to contact me with questions.  Sincerely,  Thank you so much for letting me partake in your care today.  Don't hesitate to reach out if you have any additional concerns!  Ferol Luz, MD  [facility] , [place]

## 2023-04-26 LAB — ALLERGENS, ZONE 2
Alternaria Alternata IgE: <0.1 kU/L
Amer Sycamore IgE Qn: <0.1 kU/L
Aspergillus Fumigatus IgE: <0.1 kU/L
Bahia Grass IgE: 0.77 kU/L — AB
Bermuda Grass IgE: 0.47 kU/L — AB
Cat Dander IgE: 0.26 kU/L — AB
Cedar, Mountain IgE: <0.1 kU/L
Cladosporium Herbarum IgE: <0.1 kU/L
Cockroach, American IgE: 0.14 kU/L — AB
Common Silver Birch IgE: <0.1 kU/L
D Farinae IgE: 4.59 kU/L — AB
D Pteronyssinus IgE: 4.93 kU/L — AB
Dog Dander IgE: 1.68 kU/L — AB
Elm, American IgE: <0.1 kU/L
Hickory, White IgE: <0.1 kU/L
Johnson Grass IgE: 0.65 kU/L — AB
Maple/Box Elder IgE: <0.1 kU/L
Mucor Racemosus IgE: <0.1 kU/L
Mugwort IgE Qn: <0.1 kU/L
Nettle IgE: <0.1 kU/L
Oak, White IgE: <0.1 kU/L
Penicillium Chrysogen IgE: <0.1 kU/L
Pigweed, Rough IgE: <0.1 kU/L
Plantain, English IgE: <0.1 kU/L
Ragweed, Short IgE: 0.14 kU/L — AB
Sheep Sorrel IgE Qn: <0.1 kU/L
Stemphylium Herbarum IgE: <0.1 kU/L
Sweet gum IgE RAST Ql: <0.1 kU/L
Timothy Grass IgE: 1.12 kU/L — AB
White Mulberry IgE: <0.1 kU/L

## 2023-05-18 ENCOUNTER — Telehealth: Payer: Self-pay

## 2023-05-18 NOTE — Telephone Encounter (Signed)
Patient's wife is calling back in regards to patient's lab results. Please advice.

## 2023-05-18 NOTE — Telephone Encounter (Signed)
Patient informed of lab results. I also informed him someone from our [place] location will be calling to schedule.

## 2023-05-18 NOTE — Telephone Encounter (Signed)
Blood work with persistent sensitizations to grass, roach, cat, dog, ragweed.  I do recommend restarting Ait via [employer].    Can we get him scheduled for R[facility] at [facility]?

## 2023-05-31 ENCOUNTER — Other Ambulatory Visit: Payer: Self-pay | Admitting: Internal Medicine

## 2023-05-31 DIAGNOSIS — J3089 Other allergic rhinitis: Secondary | ICD-10-CM

## 2023-06-01 ENCOUNTER — Telehealth: Payer: Self-pay

## 2023-06-01 DIAGNOSIS — J3089 Other allergic rhinitis: Secondary | ICD-10-CM

## 2023-06-01 MED ORDER — PREDNISONE 20 MG PO TABS: ORAL_TABLET | ORAL | 0 refills | Status: AC

## 2023-06-01 MED ORDER — EPINEPHRINE 0.3 MG/0.3ML IJ SOAJ: 0.3000 mg | INTRAMUSCULAR | 1 refills | Status: DC | PRN

## 2023-06-01 MED ORDER — CETIRIZINE HCL 10 MG PO TABS: ORAL_TABLET | ORAL | 0 refills | Status: AC

## 2023-06-01 NOTE — Progress Notes (Signed)
 VIAL SET MADE 06-01-23. EXP 05-31-24

## 2023-06-01 NOTE — Progress Notes (Signed)
 Aeroallergen Immunotherapy  Ordering Provider: Dr. Ferol Luz  Patient Details Name: JALENE LACKO II MRN: [id number] Date of Birth: 18-Aug-1970  Order 1 of 1  Vial Label: G-W-C-D-DM-R  0.3 ml (Volume)  BAU Concentration -- 7 Grass Mix* 100,000 ([address] [place], [place], [place], [place], [place], [place], [place]) 0.2 ml (Volume)  1:20 Concentration -- [place] 0.3 ml (Volume)  BAU Concentration -- [place] 10,000 0.2 ml (Volume)  1:20 Concentration -- [place] 0.3 ml (Volume)  1:20 Concentration -- Ragweed Mix 0.5 ml (Volume)  1:10 Concentration -- Cat Hair 0.3 ml (Volume)  1:20 Concentration -- Cockroach, [place] 0.5 ml (Volume)  1:10 Concentration -- Dog Epithelia 0.5 ml (Volume)   AU Concentration -- Mite Mix (DF 5,000 & DP 5,000)   3.1  ml Extract Subtotal 1.9  ml Diluent 5.0  ml Maintenance Total  Schedule:  RUSH (10 doses) Silver Vial (1:1,000,000): RUSH Blue Vial (1:100,000): RUSH Yellow Vial (1:10,000): RUSH Green Vial (1:1,000): Schedule B (6 doses) Red Vial (1:100): Schedule A (11 doses)  Special Instructions: After completion of the first Red Vial, please space to every two weeks. After completion of the second Red Vial, please space to every 4 weeks. Ok to up dose new vials at 0.62mL --> 0.3 mL --> 0.5 mL. Ok to come twice weekly, if desired, as long as there is 48 hours between injections.

## 2023-06-01 NOTE — Telephone Encounter (Signed)
 Pt was called to go over RUSH appt and pre-meds, no answer LVM to call, and that pre-meds will be sent to pharmacy on file.

## 2023-06-02 ENCOUNTER — Other Ambulatory Visit: Payer: Self-pay

## 2023-06-02 MED ORDER — RYALTRIS 665-25 MCG/ACT NA SUSP: NASAL | 5 refills | Status: AC

## 2023-06-03 ENCOUNTER — Telehealth: Payer: Self-pay

## 2023-06-03 MED ORDER — FAMOTIDINE 20 MG PO TABS: ORAL_TABLET | ORAL | 0 refills | Status: AC

## 2023-06-07 ENCOUNTER — Other Ambulatory Visit: Payer: Self-pay

## 2023-06-07 MED ORDER — EPINEPHRINE 0.3 MG/0.3ML IJ SOAJ: 0.3000 mg | INTRAMUSCULAR | 1 refills | Status: AC | PRN

## 2023-06-07 NOTE — Telephone Encounter (Signed)
 Pre-meds sent pharmacy and pt aware

## 2023-06-07 NOTE — Telephone Encounter (Signed)
 Thank you spoke with and was able to settle that  issues.

## 2023-06-07 NOTE — Telephone Encounter (Signed)
 Patients is only able to get Epi Pen for $400 at [facility]. They informed patient and wife that there is not a cheaper alternative. Wife would like the Epi Pen sent to [facility] and if they can reschedule [facility] since it takes about a week to get the mail order.

## 2023-06-08 ENCOUNTER — Ambulatory Visit (INDEPENDENT_AMBULATORY_CARE_PROVIDER_SITE_OTHER): Payer: Non-veteran care | Admitting: Internal Medicine

## 2023-06-08 ENCOUNTER — Encounter: Payer: Self-pay | Admitting: Internal Medicine

## 2023-06-08 VITALS — BP 130/78 | HR 80 | Temp 98.6°F | Resp 18

## 2023-06-08 DIAGNOSIS — J309 Allergic rhinitis, unspecified: Secondary | ICD-10-CM

## 2023-06-08 DIAGNOSIS — J3089 Other allergic rhinitis: Secondary | ICD-10-CM

## 2023-06-08 NOTE — Progress Notes (Unsigned)
 RAPID DESENSITIZATION Note  RE: Justin Greer MRN: [id number] DOB: [birth year] Date of Office Visit: 06/08/2023  Subjective:  Patient presents today for rapid desensitization.  Interval History: Patient has not been ill, he has taken all premedications as per protocol.  Recent/Current History: Pulmonary disease: no Cardiac disease: no Respiratory infection: no Rash: no Itch: no Swelling: no Cough: no Shortness of breath: no Runny/stuffy nose: no Itchy eyes: no Beta-blocker use: no  Patient/guardian was informed of the procedure with verbalized understanding of the risk of anaphylaxis. Consent has been signed.   Medication List:  Current Outpatient Medications  Medication Sig Dispense Refill   cetirizine (ZYRTEC) 10 MG tablet Take 1 tab day before and 1 tab day off [facility] appt. 2 tablet 0   Cholecalciferol (VITAMIN D3) 50 MCG (2000 UT) TABS Take 200 Units by mouth daily.     diazepam (VALIUM) 10 MG tablet Take 10 mg by mouth 3 (three) times daily as needed for anxiety (or spasms).     DULoxetine (CYMBALTA) 30 MG capsule Take 30 mg by mouth at bedtime.     DULoxetine (CYMBALTA) 60 MG capsule Take 60 mg by mouth in the morning.     EPINEPHrine 0.3 mg/0.3 mL IJ SOAJ injection Inject 0.3 mg into the muscle as needed for anaphylaxis. 1 each 1   famotidine (PEPCID) 20 MG tablet Take 1 tab twice daily Monday and Tuesday before [facility] appt. 4 tablet 0   ferrous sulfate 325 (65 FE) MG tablet Take 1 tablet by mouth every other day.     hydrALAZINE (APRESOLINE) 25 MG tablet Take 25 mg by mouth in the morning and at bedtime.     losartan (COZAAR) 100 MG tablet Take 100 mg by mouth daily.     montelukast (SINGULAIR) 10 MG tablet Take 1 tablet (10 mg total) by mouth at bedtime. 30 tablet 0   Olopatadine HCl (PATADAY) 0.7 % SOLN Apply 1 drop to eye daily as needed (itchy watery eyes). 2.5 mL 5   Olopatadine-Mometasone (RYALTRIS) 665-25 MCG/ACT SUSP 1 spray per nostril twice daily 29 g 5    pantoprazole (PROTONIX) 40 MG tablet Take 40 mg by mouth daily before breakfast.     predniSONE (DELTASONE) 20 MG tablet Take 2 tabs when day before and 2 tabs the day off [facility] appt. 4 tablet 0   propranolol (INDERAL) 40 MG tablet Take 40 mg by mouth in the morning and at bedtime.     tadalafil (CIALIS) 10 MG tablet Take 10 mg by mouth daily as needed for erectile dysfunction.     No current facility-administered medications for this visit.   Allergies: Allergies  Allergen Reactions   Lisinopril Cough   Venlafaxine Swelling and Other (See Comments)    Pt reports swelling in his legs   Effexor Xr [Venlafaxine Hcl Er] Other (See Comments) and Hypertension    Elevated blood pressure, Weight gain   Sildenafil Other (See Comments)    Headaches   Amlodipine Swelling, Other (See Comments) and Cough    Pt reports swelling in his legs/edema.   I reviewed his past medical history, social history, family history, and environmental history and no significant changes have been reported from his previous visit.  ROS: Negative except as per HPI.  Objective: There were no vitals taken for this visit. There is no height or weight on file to calculate BMI.   General Appearance:  Alert, cooperative, no distress, appears stated age  Head:  Normocephalic, without obvious  abnormality, atraumatic  Eyes:  Conjunctiva clear, EOM's intact  Nose: Nares normal  Throat: Lips, tongue normal; teeth and gums normal, normal  posterior oropharnyx  Neck: Supple, symmetrical  Lungs:   CTAB , Respirations unlabored, no coughing  Heart:  Appears well perfused  Extremities: No edema  Skin: Skin color, texture, turgor normal, no rashes or lesions on visualized portions of skin  Neurologic: No gross deficits     Diagnostics: PROCEDURES:  Patient received the following doses every hour: Step 1:  0.13ml - 1:1,000,000 dilution (silver vial) Step 2:  0.62ml - 1:1,000,000 dilution (silver vial) Step 3: 0.71ml -  1:100,000 dilution (blue vial)  Step 4: 0.62ml - 1:100,000 dilution (blue vial)  Step 5: 0.64ml - 1:10,000 dilution (gold vial) Step 6: 0.1ml - 1:10,000 dilution (gold vial) Step 7: 0.49ml - 1:10,000 dilution (gold vial) Step 8: 0.105ml - 1:10,000 dilution (gold vial)  Patient was observed for 1 hour after the last dose.   Procedure started at *** Procedure ended at ***   ASSESSMENT/PLAN:   Patient has tolerated the rapid desensitization protocol.  Next appointment: Start at 0.30ml of 1:1000 dilution (green vial) and build up per protocol.

## 2023-06-17 ENCOUNTER — Ambulatory Visit (INDEPENDENT_AMBULATORY_CARE_PROVIDER_SITE_OTHER): Admitting: *Deleted

## 2023-06-17 DIAGNOSIS — J309 Allergic rhinitis, unspecified: Secondary | ICD-10-CM | POA: Diagnosis not present

## 2023-06-30 ENCOUNTER — Ambulatory Visit (INDEPENDENT_AMBULATORY_CARE_PROVIDER_SITE_OTHER)

## 2023-06-30 DIAGNOSIS — J309 Allergic rhinitis, unspecified: Secondary | ICD-10-CM | POA: Diagnosis not present

## 2023-07-07 ENCOUNTER — Ambulatory Visit (INDEPENDENT_AMBULATORY_CARE_PROVIDER_SITE_OTHER)

## 2023-07-07 DIAGNOSIS — J309 Allergic rhinitis, unspecified: Secondary | ICD-10-CM | POA: Diagnosis not present

## 2023-07-15 ENCOUNTER — Ambulatory Visit (INDEPENDENT_AMBULATORY_CARE_PROVIDER_SITE_OTHER): Admitting: *Deleted

## 2023-07-15 DIAGNOSIS — J309 Allergic rhinitis, unspecified: Secondary | ICD-10-CM

## 2023-07-22 ENCOUNTER — Ambulatory Visit (INDEPENDENT_AMBULATORY_CARE_PROVIDER_SITE_OTHER): Admitting: *Deleted

## 2023-07-22 DIAGNOSIS — J309 Allergic rhinitis, unspecified: Secondary | ICD-10-CM | POA: Diagnosis not present

## 2023-08-04 ENCOUNTER — Ambulatory Visit (INDEPENDENT_AMBULATORY_CARE_PROVIDER_SITE_OTHER)

## 2023-08-04 DIAGNOSIS — J309 Allergic rhinitis, unspecified: Secondary | ICD-10-CM

## 2023-08-11 ENCOUNTER — Ambulatory Visit (INDEPENDENT_AMBULATORY_CARE_PROVIDER_SITE_OTHER)

## 2023-08-11 DIAGNOSIS — J309 Allergic rhinitis, unspecified: Secondary | ICD-10-CM | POA: Diagnosis not present

## 2023-08-18 ENCOUNTER — Ambulatory Visit (INDEPENDENT_AMBULATORY_CARE_PROVIDER_SITE_OTHER)

## 2023-08-18 DIAGNOSIS — J309 Allergic rhinitis, unspecified: Secondary | ICD-10-CM | POA: Diagnosis not present

## 2023-08-25 ENCOUNTER — Ambulatory Visit (INDEPENDENT_AMBULATORY_CARE_PROVIDER_SITE_OTHER)

## 2023-08-25 DIAGNOSIS — J309 Allergic rhinitis, unspecified: Secondary | ICD-10-CM | POA: Diagnosis not present

## 2023-09-01 ENCOUNTER — Ambulatory Visit (INDEPENDENT_AMBULATORY_CARE_PROVIDER_SITE_OTHER)

## 2023-09-01 DIAGNOSIS — J309 Allergic rhinitis, unspecified: Secondary | ICD-10-CM

## 2023-09-08 ENCOUNTER — Encounter: Payer: Self-pay | Admitting: Allergy and Immunology

## 2023-09-08 ENCOUNTER — Ambulatory Visit (INDEPENDENT_AMBULATORY_CARE_PROVIDER_SITE_OTHER)

## 2023-09-08 DIAGNOSIS — J309 Allergic rhinitis, unspecified: Secondary | ICD-10-CM | POA: Diagnosis not present

## 2023-09-16 ENCOUNTER — Ambulatory Visit (INDEPENDENT_AMBULATORY_CARE_PROVIDER_SITE_OTHER): Payer: Self-pay | Admitting: *Deleted

## 2023-09-16 ENCOUNTER — Encounter: Payer: Self-pay | Admitting: Allergy and Immunology

## 2023-09-16 DIAGNOSIS — J309 Allergic rhinitis, unspecified: Secondary | ICD-10-CM | POA: Diagnosis not present

## 2023-09-22 ENCOUNTER — Encounter: Payer: Self-pay | Admitting: Allergy and Immunology

## 2023-09-22 ENCOUNTER — Ambulatory Visit (INDEPENDENT_AMBULATORY_CARE_PROVIDER_SITE_OTHER)

## 2023-09-22 DIAGNOSIS — J309 Allergic rhinitis, unspecified: Secondary | ICD-10-CM | POA: Diagnosis not present

## 2023-09-29 ENCOUNTER — Ambulatory Visit (INDEPENDENT_AMBULATORY_CARE_PROVIDER_SITE_OTHER)

## 2023-09-29 ENCOUNTER — Encounter: Payer: Self-pay | Admitting: Allergy and Immunology

## 2023-09-29 DIAGNOSIS — J309 Allergic rhinitis, unspecified: Secondary | ICD-10-CM | POA: Diagnosis not present

## 2023-10-06 ENCOUNTER — Ambulatory Visit (INDEPENDENT_AMBULATORY_CARE_PROVIDER_SITE_OTHER)

## 2023-10-06 ENCOUNTER — Encounter: Payer: Self-pay | Admitting: Allergy and Immunology

## 2023-10-06 DIAGNOSIS — J309 Allergic rhinitis, unspecified: Secondary | ICD-10-CM | POA: Diagnosis not present

## 2023-10-13 ENCOUNTER — Ambulatory Visit (INDEPENDENT_AMBULATORY_CARE_PROVIDER_SITE_OTHER)

## 2023-10-13 DIAGNOSIS — J309 Allergic rhinitis, unspecified: Secondary | ICD-10-CM

## 2023-10-20 ENCOUNTER — Ambulatory Visit (INDEPENDENT_AMBULATORY_CARE_PROVIDER_SITE_OTHER)

## 2023-10-20 DIAGNOSIS — J309 Allergic rhinitis, unspecified: Secondary | ICD-10-CM

## 2023-10-27 ENCOUNTER — Encounter: Payer: Self-pay | Admitting: Allergy and Immunology

## 2023-10-27 ENCOUNTER — Ambulatory Visit (INDEPENDENT_AMBULATORY_CARE_PROVIDER_SITE_OTHER)

## 2023-10-27 DIAGNOSIS — J309 Allergic rhinitis, unspecified: Secondary | ICD-10-CM

## 2023-11-03 ENCOUNTER — Encounter: Payer: Self-pay | Admitting: Allergy and Immunology

## 2023-11-03 ENCOUNTER — Ambulatory Visit (INDEPENDENT_AMBULATORY_CARE_PROVIDER_SITE_OTHER)

## 2023-11-03 DIAGNOSIS — J309 Allergic rhinitis, unspecified: Secondary | ICD-10-CM | POA: Diagnosis not present

## 2023-11-10 ENCOUNTER — Encounter: Payer: Self-pay | Admitting: *Deleted

## 2023-11-10 ENCOUNTER — Ambulatory Visit (INDEPENDENT_AMBULATORY_CARE_PROVIDER_SITE_OTHER): Admitting: *Deleted

## 2023-11-10 DIAGNOSIS — J309 Allergic rhinitis, unspecified: Secondary | ICD-10-CM | POA: Diagnosis not present

## 2023-11-10 DIAGNOSIS — J301 Allergic rhinitis due to pollen: Secondary | ICD-10-CM | POA: Diagnosis not present

## 2023-11-10 DIAGNOSIS — J3081 Allergic rhinitis due to animal (cat) (dog) hair and dander: Secondary | ICD-10-CM | POA: Diagnosis not present

## 2023-11-10 DIAGNOSIS — J3089 Other allergic rhinitis: Secondary | ICD-10-CM | POA: Diagnosis not present

## 2023-11-10 NOTE — Progress Notes (Signed)
 VIAL MADE ON 11/10/23

## 2023-11-17 ENCOUNTER — Encounter: Payer: Self-pay | Admitting: Allergy and Immunology

## 2023-11-17 ENCOUNTER — Ambulatory Visit (INDEPENDENT_AMBULATORY_CARE_PROVIDER_SITE_OTHER): Payer: Self-pay | Admitting: *Deleted

## 2023-11-17 DIAGNOSIS — J309 Allergic rhinitis, unspecified: Secondary | ICD-10-CM | POA: Diagnosis not present

## 2023-11-24 ENCOUNTER — Encounter: Payer: Self-pay | Admitting: Allergy and Immunology

## 2023-11-24 ENCOUNTER — Ambulatory Visit (INDEPENDENT_AMBULATORY_CARE_PROVIDER_SITE_OTHER)

## 2023-11-24 DIAGNOSIS — J309 Allergic rhinitis, unspecified: Secondary | ICD-10-CM | POA: Diagnosis not present

## 2023-11-30 ENCOUNTER — Ambulatory Visit (INDEPENDENT_AMBULATORY_CARE_PROVIDER_SITE_OTHER)

## 2023-11-30 DIAGNOSIS — J309 Allergic rhinitis, unspecified: Secondary | ICD-10-CM

## 2023-12-09 ENCOUNTER — Ambulatory Visit (INDEPENDENT_AMBULATORY_CARE_PROVIDER_SITE_OTHER): Admitting: *Deleted

## 2023-12-09 ENCOUNTER — Encounter: Payer: Self-pay | Admitting: Allergy and Immunology

## 2023-12-09 DIAGNOSIS — J309 Allergic rhinitis, unspecified: Secondary | ICD-10-CM

## 2024-01-05 ENCOUNTER — Encounter: Payer: Self-pay | Admitting: Allergy and Immunology

## 2024-01-05 ENCOUNTER — Ambulatory Visit

## 2024-01-05 DIAGNOSIS — J309 Allergic rhinitis, unspecified: Secondary | ICD-10-CM

## 2024-02-09 ENCOUNTER — Ambulatory Visit (INDEPENDENT_AMBULATORY_CARE_PROVIDER_SITE_OTHER)

## 2024-02-09 ENCOUNTER — Encounter: Payer: Self-pay | Admitting: Allergy and Immunology

## 2024-02-09 DIAGNOSIS — J309 Allergic rhinitis, unspecified: Secondary | ICD-10-CM | POA: Diagnosis not present

## 2024-03-09 ENCOUNTER — Encounter: Payer: Self-pay | Admitting: Allergy and Immunology

## 2024-03-09 ENCOUNTER — Ambulatory Visit: Admitting: *Deleted

## 2024-03-09 DIAGNOSIS — J309 Allergic rhinitis, unspecified: Secondary | ICD-10-CM | POA: Diagnosis not present

## 2024-04-11 ENCOUNTER — Encounter: Payer: Self-pay | Admitting: Family Medicine

## 2024-04-11 DIAGNOSIS — J302 Other seasonal allergic rhinitis: Secondary | ICD-10-CM | POA: Diagnosis not present
